# Patient Record
Sex: Female | Born: 1950
Health system: Southern US, Community
[De-identification: ages and names within clinical notes are randomized; demographics above are authoritative.]

## PROBLEM LIST (undated history)

## (undated) DIAGNOSIS — J3089 Other allergic rhinitis: Secondary | ICD-10-CM

## (undated) DIAGNOSIS — C4492 Squamous cell carcinoma of skin, unspecified: Secondary | ICD-10-CM

## (undated) HISTORY — DX: Other allergic rhinitis: J30.89

## (undated) HISTORY — PX: VSD REPAIR: SHX276

---
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
## 1999-01-23 ENCOUNTER — Ambulatory Visit (HOSPITAL_COMMUNITY): Admission: RE | Admit: 1999-01-23 | Discharge: 1999-01-23 | Payer: Self-pay | Admitting: Pediatrics

## 1999-01-23 ENCOUNTER — Encounter: Payer: Self-pay | Admitting: Gynecology

## 1999-01-30 ENCOUNTER — Encounter: Payer: Self-pay | Admitting: Gynecology

## 1999-01-30 ENCOUNTER — Ambulatory Visit (HOSPITAL_COMMUNITY): Admission: RE | Admit: 1999-01-30 | Discharge: 1999-01-30 | Payer: Self-pay | Admitting: Gynecology

## 2000-02-19 ENCOUNTER — Ambulatory Visit (HOSPITAL_COMMUNITY): Admission: RE | Admit: 2000-02-19 | Discharge: 2000-02-19 | Payer: Self-pay | Admitting: Gynecology

## 2000-02-19 ENCOUNTER — Encounter: Payer: Self-pay | Admitting: Gynecology

## 2001-02-21 ENCOUNTER — Encounter: Payer: Self-pay | Admitting: Gynecology

## 2001-02-21 ENCOUNTER — Ambulatory Visit (HOSPITAL_COMMUNITY): Admission: RE | Admit: 2001-02-21 | Discharge: 2001-02-21 | Payer: Self-pay | Admitting: Gynecology

## 2001-09-13 ENCOUNTER — Other Ambulatory Visit: Admission: RE | Admit: 2001-09-13 | Discharge: 2001-09-13 | Payer: Self-pay | Admitting: Gynecology

## 2002-06-15 ENCOUNTER — Encounter: Payer: Self-pay | Admitting: Gynecology

## 2002-06-15 ENCOUNTER — Ambulatory Visit (HOSPITAL_COMMUNITY): Admission: RE | Admit: 2002-06-15 | Discharge: 2002-06-15 | Payer: Self-pay | Admitting: Gynecology

## 2002-09-28 ENCOUNTER — Other Ambulatory Visit: Admission: RE | Admit: 2002-09-28 | Discharge: 2002-09-28 | Payer: Self-pay | Admitting: Gynecology

## 2003-07-20 ENCOUNTER — Ambulatory Visit (HOSPITAL_COMMUNITY): Admission: RE | Admit: 2003-07-20 | Discharge: 2003-07-20 | Payer: Self-pay | Admitting: Gynecology

## 2003-07-20 ENCOUNTER — Encounter: Payer: Self-pay | Admitting: Gynecology

## 2003-10-29 ENCOUNTER — Other Ambulatory Visit: Admission: RE | Admit: 2003-10-29 | Discharge: 2003-10-29 | Payer: Self-pay | Admitting: Gynecology

## 2004-02-15 ENCOUNTER — Ambulatory Visit (HOSPITAL_COMMUNITY): Admission: RE | Admit: 2004-02-15 | Discharge: 2004-02-15 | Payer: Self-pay | Admitting: Gastroenterology

## 2004-08-11 ENCOUNTER — Ambulatory Visit (HOSPITAL_COMMUNITY): Admission: RE | Admit: 2004-08-11 | Discharge: 2004-08-11 | Payer: Self-pay | Admitting: Gynecology

## 2004-11-03 ENCOUNTER — Other Ambulatory Visit: Admission: RE | Admit: 2004-11-03 | Discharge: 2004-11-03 | Payer: Self-pay | Admitting: Gynecology

## 2005-09-30 ENCOUNTER — Ambulatory Visit (HOSPITAL_COMMUNITY): Admission: RE | Admit: 2005-09-30 | Discharge: 2005-09-30 | Payer: Self-pay | Admitting: Gynecology

## 2006-10-01 ENCOUNTER — Ambulatory Visit (HOSPITAL_COMMUNITY): Admission: RE | Admit: 2006-10-01 | Discharge: 2006-10-01 | Payer: Self-pay | Admitting: Gynecology

## 2007-10-03 ENCOUNTER — Ambulatory Visit (HOSPITAL_COMMUNITY): Admission: RE | Admit: 2007-10-03 | Discharge: 2007-10-03 | Payer: Self-pay | Admitting: Gynecology

## 2008-10-16 ENCOUNTER — Ambulatory Visit (HOSPITAL_COMMUNITY): Admission: RE | Admit: 2008-10-16 | Discharge: 2008-10-16 | Payer: Self-pay | Admitting: Gynecology

## 2009-10-17 ENCOUNTER — Ambulatory Visit (HOSPITAL_COMMUNITY): Admission: RE | Admit: 2009-10-17 | Discharge: 2009-10-17 | Payer: Self-pay | Admitting: Gynecology

## 2010-10-22 ENCOUNTER — Ambulatory Visit (HOSPITAL_COMMUNITY)
Admission: RE | Admit: 2010-10-22 | Discharge: 2010-10-22 | Payer: Self-pay | Source: Home / Self Care | Admitting: Gynecology

## 2011-01-07 ENCOUNTER — Other Ambulatory Visit (HOSPITAL_COMMUNITY): Payer: Self-pay | Admitting: Cardiology

## 2011-01-07 DIAGNOSIS — Q211 Atrial septal defect: Secondary | ICD-10-CM

## 2011-01-13 ENCOUNTER — Ambulatory Visit (HOSPITAL_COMMUNITY)
Admission: RE | Admit: 2011-01-13 | Discharge: 2011-01-13 | Disposition: A | Discharge status: Home / Self Care | Payer: PRIVATE HEALTH INSURANCE | Source: Ambulatory Visit | Attending: Cardiology | Admitting: Cardiology

## 2011-01-13 DIAGNOSIS — Q2111 Secundum atrial septal defect: Secondary | ICD-10-CM | POA: Insufficient documentation

## 2011-01-13 DIAGNOSIS — Q211 Atrial septal defect: Secondary | ICD-10-CM | POA: Insufficient documentation

## 2011-01-13 LAB — CREATININE, SERUM: GFR calc non Af Amer: >60 mL/min (ref >60)

## 2011-02-18 ENCOUNTER — Ambulatory Visit (HOSPITAL_COMMUNITY)
Admission: RE | Admit: 2011-02-18 | Discharge: 2011-02-18 | Disposition: A | Discharge status: Home / Self Care | Payer: PRIVATE HEALTH INSURANCE | Source: Ambulatory Visit | Attending: Cardiology | Admitting: Cardiology

## 2011-02-18 ENCOUNTER — Other Ambulatory Visit: Payer: Self-pay | Admitting: Cardiology

## 2011-02-18 DIAGNOSIS — Q2111 Secundum atrial septal defect: Secondary | ICD-10-CM | POA: Insufficient documentation

## 2011-02-18 DIAGNOSIS — Q211 Atrial septal defect: Secondary | ICD-10-CM | POA: Insufficient documentation

## 2011-03-12 ENCOUNTER — Ambulatory Visit: Payer: PRIVATE HEALTH INSURANCE | Admitting: Cardiovascular Disease

## 2011-05-07 ENCOUNTER — Other Ambulatory Visit: Payer: Self-pay | Admitting: Internal Medicine

## 2011-05-08 ENCOUNTER — Ambulatory Visit
Admission: RE | Admit: 2011-05-08 | Discharge: 2011-05-08 | Disposition: A | Discharge status: Home / Self Care | Payer: PRIVATE HEALTH INSURANCE | Source: Ambulatory Visit | Attending: Internal Medicine | Admitting: Internal Medicine

## 2011-05-08 ENCOUNTER — Other Ambulatory Visit: Payer: Self-pay | Admitting: Internal Medicine

## 2011-05-12 ENCOUNTER — Ambulatory Visit
Admission: RE | Admit: 2011-05-12 | Discharge: 2011-05-12 | Disposition: A | Discharge status: Home / Self Care | Payer: PRIVATE HEALTH INSURANCE | Source: Ambulatory Visit | Attending: Internal Medicine | Admitting: Internal Medicine

## 2011-09-21 ENCOUNTER — Other Ambulatory Visit (HOSPITAL_COMMUNITY): Payer: Self-pay | Admitting: Gynecology

## 2011-09-21 DIAGNOSIS — Z1231 Encounter for screening mammogram for malignant neoplasm of breast: Secondary | ICD-10-CM

## 2011-10-26 ENCOUNTER — Ambulatory Visit (HOSPITAL_COMMUNITY)
Admission: RE | Admit: 2011-10-26 | Discharge: 2011-10-26 | Disposition: A | Discharge status: Home / Self Care | Payer: PRIVATE HEALTH INSURANCE | Source: Ambulatory Visit | Attending: Gynecology | Admitting: Gynecology

## 2011-10-26 DIAGNOSIS — Z1231 Encounter for screening mammogram for malignant neoplasm of breast: Secondary | ICD-10-CM | POA: Insufficient documentation

## 2012-10-05 ENCOUNTER — Other Ambulatory Visit (HOSPITAL_COMMUNITY): Payer: Self-pay | Admitting: Gynecology

## 2012-10-05 DIAGNOSIS — Z1231 Encounter for screening mammogram for malignant neoplasm of breast: Secondary | ICD-10-CM

## 2012-10-31 ENCOUNTER — Ambulatory Visit (HOSPITAL_COMMUNITY): Payer: PRIVATE HEALTH INSURANCE

## 2012-11-03 ENCOUNTER — Ambulatory Visit (HOSPITAL_COMMUNITY)
Admission: RE | Admit: 2012-11-03 | Discharge: 2012-11-03 | Disposition: A | Discharge status: Home / Self Care | Payer: Commercial Managed Care - PPO | Source: Ambulatory Visit | Attending: Gynecology | Admitting: Gynecology

## 2012-11-03 DIAGNOSIS — Z1231 Encounter for screening mammogram for malignant neoplasm of breast: Secondary | ICD-10-CM

## 2013-06-22 ENCOUNTER — Other Ambulatory Visit (HOSPITAL_COMMUNITY): Payer: Self-pay | Admitting: Internal Medicine

## 2013-06-22 DIAGNOSIS — R1013 Epigastric pain: Secondary | ICD-10-CM

## 2013-06-27 ENCOUNTER — Ambulatory Visit (HOSPITAL_COMMUNITY)
Admission: RE | Admit: 2013-06-27 | Discharge: 2013-06-27 | Disposition: A | Discharge status: Home / Self Care | Payer: Commercial Managed Care - PPO | Source: Ambulatory Visit | Attending: Internal Medicine | Admitting: Internal Medicine

## 2013-06-27 DIAGNOSIS — R1013 Epigastric pain: Secondary | ICD-10-CM | POA: Insufficient documentation

## 2013-06-27 MED ORDER — TECHNETIUM TC 99M SULFUR COLLOID
2.0000 | Freq: Once | INTRAVENOUS | Status: AC | PRN
  Administered 2013-06-27: 2 via ORAL

## 2013-10-09 ENCOUNTER — Other Ambulatory Visit (HOSPITAL_COMMUNITY): Payer: Self-pay | Admitting: Gynecology

## 2013-10-09 DIAGNOSIS — Z1231 Encounter for screening mammogram for malignant neoplasm of breast: Secondary | ICD-10-CM

## 2013-11-06 ENCOUNTER — Ambulatory Visit (HOSPITAL_COMMUNITY)
Admission: RE | Admit: 2013-11-06 | Discharge: 2013-11-06 | Disposition: A | Discharge status: Home / Self Care | Payer: Commercial Managed Care - PPO | Source: Ambulatory Visit | Attending: Gynecology | Admitting: Gynecology

## 2013-11-06 DIAGNOSIS — Z1231 Encounter for screening mammogram for malignant neoplasm of breast: Secondary | ICD-10-CM

## 2014-10-12 ENCOUNTER — Other Ambulatory Visit (HOSPITAL_COMMUNITY): Payer: Self-pay | Admitting: Gynecology

## 2014-10-12 DIAGNOSIS — Z1231 Encounter for screening mammogram for malignant neoplasm of breast: Secondary | ICD-10-CM

## 2014-11-13 ENCOUNTER — Ambulatory Visit (HOSPITAL_COMMUNITY)
Admission: RE | Admit: 2014-11-13 | Discharge: 2014-11-13 | Disposition: A | Discharge status: Home / Self Care | Payer: 59 | Source: Ambulatory Visit | Attending: Gynecology | Admitting: Gynecology

## 2014-11-13 DIAGNOSIS — Z1231 Encounter for screening mammogram for malignant neoplasm of breast: Secondary | ICD-10-CM | POA: Insufficient documentation

## 2015-05-07 ENCOUNTER — Ambulatory Visit
Admission: RE | Admit: 2015-05-07 | Discharge: 2015-05-07 | Disposition: A | Discharge status: Home / Self Care | Payer: 59 | Source: Ambulatory Visit | Attending: Internal Medicine | Admitting: Internal Medicine

## 2015-05-07 ENCOUNTER — Other Ambulatory Visit: Payer: Self-pay | Admitting: Internal Medicine

## 2015-05-07 DIAGNOSIS — R05 Cough: Secondary | ICD-10-CM

## 2015-05-07 DIAGNOSIS — R059 Cough, unspecified: Secondary | ICD-10-CM

## 2015-05-08 ENCOUNTER — Other Ambulatory Visit: Payer: Self-pay | Admitting: Internal Medicine

## 2015-05-08 DIAGNOSIS — R053 Chronic cough: Secondary | ICD-10-CM

## 2015-05-08 DIAGNOSIS — R05 Cough: Secondary | ICD-10-CM

## 2015-05-10 ENCOUNTER — Other Ambulatory Visit: Payer: 59

## 2015-06-21 ENCOUNTER — Other Ambulatory Visit: Payer: Self-pay | Admitting: Internal Medicine

## 2015-06-21 ENCOUNTER — Ambulatory Visit
Admission: RE | Admit: 2015-06-21 | Discharge: 2015-06-21 | Disposition: A | Discharge status: Home / Self Care | Payer: 59 | Source: Ambulatory Visit | Attending: Internal Medicine | Admitting: Internal Medicine

## 2015-06-21 DIAGNOSIS — R053 Chronic cough: Secondary | ICD-10-CM

## 2015-06-21 DIAGNOSIS — R05 Cough: Secondary | ICD-10-CM

## 2015-07-18 DIAGNOSIS — Z1389 Encounter for screening for other disorder: Secondary | ICD-10-CM | POA: Diagnosis not present

## 2015-07-18 DIAGNOSIS — G4700 Insomnia, unspecified: Secondary | ICD-10-CM | POA: Diagnosis not present

## 2015-07-18 DIAGNOSIS — R05 Cough: Secondary | ICD-10-CM | POA: Diagnosis not present

## 2015-08-09 DIAGNOSIS — R197 Diarrhea, unspecified: Secondary | ICD-10-CM | POA: Diagnosis not present

## 2015-08-19 DIAGNOSIS — K5289 Other specified noninfective gastroenteritis and colitis: Secondary | ICD-10-CM | POA: Diagnosis not present

## 2015-08-19 DIAGNOSIS — K529 Noninfective gastroenteritis and colitis, unspecified: Secondary | ICD-10-CM | POA: Diagnosis not present

## 2015-08-19 DIAGNOSIS — R197 Diarrhea, unspecified: Secondary | ICD-10-CM | POA: Diagnosis not present

## 2015-08-20 DIAGNOSIS — Z23 Encounter for immunization: Secondary | ICD-10-CM | POA: Diagnosis not present

## 2015-08-28 DIAGNOSIS — R05 Cough: Secondary | ICD-10-CM | POA: Diagnosis not present

## 2015-08-28 DIAGNOSIS — M545 Low back pain: Secondary | ICD-10-CM | POA: Diagnosis not present

## 2015-09-05 DIAGNOSIS — Z6821 Body mass index (BMI) 21.0-21.9, adult: Secondary | ICD-10-CM | POA: Diagnosis not present

## 2015-09-05 DIAGNOSIS — Z124 Encounter for screening for malignant neoplasm of cervix: Secondary | ICD-10-CM | POA: Diagnosis not present

## 2015-09-09 ENCOUNTER — Encounter: Payer: Self-pay | Admitting: Pulmonary Disease

## 2015-09-09 ENCOUNTER — Ambulatory Visit (INDEPENDENT_AMBULATORY_CARE_PROVIDER_SITE_OTHER): Payer: Medicare Other | Admitting: Pulmonary Disease

## 2015-09-09 VITALS — BP 118/68 | HR 72 | Ht 65.0 in | Wt 128.0 lb

## 2015-09-09 DIAGNOSIS — R05 Cough: Secondary | ICD-10-CM

## 2015-09-09 DIAGNOSIS — J3089 Other allergic rhinitis: Secondary | ICD-10-CM | POA: Diagnosis not present

## 2015-09-09 DIAGNOSIS — R059 Cough, unspecified: Secondary | ICD-10-CM

## 2015-09-09 NOTE — Progress Notes (Signed)
Subjective:    Patient ID: Whitney Johnson, female    DOB: [birth year], 64 y.o.   MRN: [id number]  HPI Consult for evaluation of chronic cough.  [name] is a healthy 64 year old. She is sent to our clinic for evaluation of chronic cough. The cough started in January and was treated at that time with a course of prednisone and antibiotics. She states that this improved her symptoms initially but then they have returned. She saw her primary care physician for this and got a couple of x-rays which did not show any acute cardiopulmonary process. She was tried on Nexium for treatment of GERD. She took this for 30 days but stopped because it did not help with her symptoms. She was also given Qvar inhaler which did not help as well. She denies any GERD symptoms. She has seasonal allergies and has severe rhinitis and postnasal drip. She is prescribed fluticasone nasal spray which she uses intermittently. She started taking Allegra over-the-counter for the past 1 week and states that this has improved her symptoms a lot.  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 There are no obvious exposures at work or at home. She has mostly hardwood at home with some areas of carpet. She uses forced air. There is no evidence of mold or dust exposure at home. She has a cat but states that she is not allergic to it.  Social history. She quit smoking in 98. Occasional alcohol use. No illegal drug use.  Past Medical History  Diagnosis Date  . Environmental and seasonal allergies      Current outpatient prescriptions:  .  aspirin 81 MG tablet, Take 81 mg by mouth daily., Disp: , Rfl:  .  fluticasone (VERAMYST) 27.5 MCG/SPRAY nasal spray, Place 2 sprays into the nose daily., Disp: , Rfl:  .  FLUZONE HIGH-DOSE 0.5 ML SUSY, ADM 0.5ML IM UTD, Disp: , Rfl: 0 .  venlafaxine XR (EFFEXOR-XR) 150 MG 24 hr capsule, Take 150 mg by mouth daily with breakfast., Disp: , Rfl:  .  zolpidem (AMBIEN) 10 MG  tablet, Take 10 mg by mouth at bedtime as needed for sleep., Disp: , Rfl:    Review of Systems  Complains of nonproductive cough. Denies any dyspnea, wheezing, sputum production, hemoptysis. Has allergic rhinitis, postnasal drip. Denies any heartburn or acid reflux. Denies any chest pain, palpitations. Denies any nausea, vomiting, diarrhea, constipation. All other review of systems negative.  Blood pressure 118/68, pulse 72, height 5\' 5"  (1.651 m), weight 128 lb (58.06 kg), SpO2 95 %.     Objective:   Physical Exam Gen.: No apparent distress Neuro: No gross focal deficits. Neck: No JVD, lymphadenopathy, thyromegaly. RS: Clear, no wheeze, crackles. CVS: S1-S2 heard, no murmurs rubs gallops. Abdomen: Soft, positive bowel sounds. Extremities: No edema.    Assessment & Plan:   Chronic cough.  It is likely that her cough is related to her allergies, rhinitis, postnasal drip. She may have silent acid reflux. But a trial of PPI has been unsuccessful in the past. Cough variant asthma is also a possibility but she has not had a response to Qvar either. Her symptoms have improved since the referral was made after she started herself on Allegra. She feels that she is doing better now and does not want an extensive evaluation.  I discussed with her that she should continue the Allegra and take the fluticasone nasal spray more regularly. If her symptoms should worsen then  we can consider getting lung function tests with a bronchodilator response, retrying her on a higher dose of PPI and an ENT consult to evaluate laryngeal pharyngeal reflux. For now she would prefer to continue the treatment as she is doing.  Plan: - Continue the allegra. - Take the Fluticasone nasal spray more regularly  Return to clinic in 6 months.  Marshell Garfinkel MD [facility] Pager [phone] If no answer or after 3pm call: [phone] 09/09/2015, 12:46 PM

## 2015-09-09 NOTE — Patient Instructions (Addendum)
Please continue using the allegra and fluticasone nasal spray.  We will make a appointment to return in 6 months. Please call if you need to be seen earlier.

## 2015-09-13 DIAGNOSIS — K52832 Lymphocytic colitis: Secondary | ICD-10-CM | POA: Diagnosis not present

## 2015-10-03 DIAGNOSIS — Z1231 Encounter for screening mammogram for malignant neoplasm of breast: Secondary | ICD-10-CM | POA: Diagnosis not present

## 2015-10-03 DIAGNOSIS — N958 Other specified menopausal and perimenopausal disorders: Secondary | ICD-10-CM | POA: Diagnosis not present

## 2015-12-12 DIAGNOSIS — Z0000 Encounter for general adult medical examination without abnormal findings: Secondary | ICD-10-CM | POA: Diagnosis not present

## 2015-12-12 DIAGNOSIS — Z1389 Encounter for screening for other disorder: Secondary | ICD-10-CM | POA: Diagnosis not present

## 2015-12-12 DIAGNOSIS — Z136 Encounter for screening for cardiovascular disorders: Secondary | ICD-10-CM | POA: Diagnosis not present

## 2015-12-12 DIAGNOSIS — Z79899 Other long term (current) drug therapy: Secondary | ICD-10-CM | POA: Diagnosis not present

## 2015-12-12 DIAGNOSIS — J310 Chronic rhinitis: Secondary | ICD-10-CM | POA: Diagnosis not present

## 2015-12-25 DIAGNOSIS — M25551 Pain in right hip: Secondary | ICD-10-CM | POA: Diagnosis not present

## 2015-12-25 DIAGNOSIS — M7071 Other bursitis of hip, right hip: Secondary | ICD-10-CM | POA: Diagnosis not present

## 2016-02-12 DIAGNOSIS — M7542 Impingement syndrome of left shoulder: Secondary | ICD-10-CM | POA: Diagnosis not present

## 2016-02-24 DIAGNOSIS — H6122 Impacted cerumen, left ear: Secondary | ICD-10-CM | POA: Diagnosis not present

## 2016-03-11 DIAGNOSIS — M67912 Unspecified disorder of synovium and tendon, left shoulder: Secondary | ICD-10-CM | POA: Diagnosis not present

## 2016-03-12 DIAGNOSIS — M67912 Unspecified disorder of synovium and tendon, left shoulder: Secondary | ICD-10-CM | POA: Diagnosis not present

## 2016-03-30 DIAGNOSIS — M7502 Adhesive capsulitis of left shoulder: Secondary | ICD-10-CM | POA: Diagnosis not present

## 2016-04-06 DIAGNOSIS — J302 Other seasonal allergic rhinitis: Secondary | ICD-10-CM | POA: Diagnosis not present

## 2016-04-14 DIAGNOSIS — M25512 Pain in left shoulder: Secondary | ICD-10-CM | POA: Diagnosis not present

## 2016-05-01 DIAGNOSIS — M7502 Adhesive capsulitis of left shoulder: Secondary | ICD-10-CM | POA: Diagnosis not present

## 2016-05-05 DIAGNOSIS — M25612 Stiffness of left shoulder, not elsewhere classified: Secondary | ICD-10-CM | POA: Diagnosis not present

## 2016-05-05 DIAGNOSIS — M75122 Complete rotator cuff tear or rupture of left shoulder, not specified as traumatic: Secondary | ICD-10-CM | POA: Diagnosis not present

## 2016-05-05 DIAGNOSIS — M25512 Pain in left shoulder: Secondary | ICD-10-CM | POA: Diagnosis not present

## 2016-05-06 DIAGNOSIS — M25612 Stiffness of left shoulder, not elsewhere classified: Secondary | ICD-10-CM | POA: Diagnosis not present

## 2016-05-06 DIAGNOSIS — M75122 Complete rotator cuff tear or rupture of left shoulder, not specified as traumatic: Secondary | ICD-10-CM | POA: Diagnosis not present

## 2016-05-06 DIAGNOSIS — M25512 Pain in left shoulder: Secondary | ICD-10-CM | POA: Diagnosis not present

## 2016-05-08 DIAGNOSIS — M25612 Stiffness of left shoulder, not elsewhere classified: Secondary | ICD-10-CM | POA: Diagnosis not present

## 2016-05-08 DIAGNOSIS — M25512 Pain in left shoulder: Secondary | ICD-10-CM | POA: Diagnosis not present

## 2016-05-08 DIAGNOSIS — M75122 Complete rotator cuff tear or rupture of left shoulder, not specified as traumatic: Secondary | ICD-10-CM | POA: Diagnosis not present

## 2016-05-12 DIAGNOSIS — M25512 Pain in left shoulder: Secondary | ICD-10-CM | POA: Diagnosis not present

## 2016-05-12 DIAGNOSIS — M75122 Complete rotator cuff tear or rupture of left shoulder, not specified as traumatic: Secondary | ICD-10-CM | POA: Diagnosis not present

## 2016-05-12 DIAGNOSIS — M25612 Stiffness of left shoulder, not elsewhere classified: Secondary | ICD-10-CM | POA: Diagnosis not present

## 2016-05-14 DIAGNOSIS — M75122 Complete rotator cuff tear or rupture of left shoulder, not specified as traumatic: Secondary | ICD-10-CM | POA: Diagnosis not present

## 2016-05-14 DIAGNOSIS — M25612 Stiffness of left shoulder, not elsewhere classified: Secondary | ICD-10-CM | POA: Diagnosis not present

## 2016-05-14 DIAGNOSIS — M25512 Pain in left shoulder: Secondary | ICD-10-CM | POA: Diagnosis not present

## 2016-05-15 DIAGNOSIS — M75122 Complete rotator cuff tear or rupture of left shoulder, not specified as traumatic: Secondary | ICD-10-CM | POA: Diagnosis not present

## 2016-05-15 DIAGNOSIS — M25512 Pain in left shoulder: Secondary | ICD-10-CM | POA: Diagnosis not present

## 2016-05-15 DIAGNOSIS — M25612 Stiffness of left shoulder, not elsewhere classified: Secondary | ICD-10-CM | POA: Diagnosis not present

## 2016-05-19 DIAGNOSIS — M25512 Pain in left shoulder: Secondary | ICD-10-CM | POA: Diagnosis not present

## 2016-05-19 DIAGNOSIS — M75122 Complete rotator cuff tear or rupture of left shoulder, not specified as traumatic: Secondary | ICD-10-CM | POA: Diagnosis not present

## 2016-05-19 DIAGNOSIS — M25612 Stiffness of left shoulder, not elsewhere classified: Secondary | ICD-10-CM | POA: Diagnosis not present

## 2016-05-21 DIAGNOSIS — M25512 Pain in left shoulder: Secondary | ICD-10-CM | POA: Diagnosis not present

## 2016-05-21 DIAGNOSIS — M75122 Complete rotator cuff tear or rupture of left shoulder, not specified as traumatic: Secondary | ICD-10-CM | POA: Diagnosis not present

## 2016-05-21 DIAGNOSIS — M25612 Stiffness of left shoulder, not elsewhere classified: Secondary | ICD-10-CM | POA: Diagnosis not present

## 2016-05-27 DIAGNOSIS — M75122 Complete rotator cuff tear or rupture of left shoulder, not specified as traumatic: Secondary | ICD-10-CM | POA: Diagnosis not present

## 2016-05-27 DIAGNOSIS — M25512 Pain in left shoulder: Secondary | ICD-10-CM | POA: Diagnosis not present

## 2016-05-27 DIAGNOSIS — M25612 Stiffness of left shoulder, not elsewhere classified: Secondary | ICD-10-CM | POA: Diagnosis not present

## 2016-05-28 DIAGNOSIS — M25612 Stiffness of left shoulder, not elsewhere classified: Secondary | ICD-10-CM | POA: Diagnosis not present

## 2016-05-28 DIAGNOSIS — M75122 Complete rotator cuff tear or rupture of left shoulder, not specified as traumatic: Secondary | ICD-10-CM | POA: Diagnosis not present

## 2016-05-28 DIAGNOSIS — M25512 Pain in left shoulder: Secondary | ICD-10-CM | POA: Diagnosis not present

## 2016-05-29 DIAGNOSIS — F4320 Adjustment disorder, unspecified: Secondary | ICD-10-CM | POA: Diagnosis not present

## 2016-05-29 DIAGNOSIS — K52832 Lymphocytic colitis: Secondary | ICD-10-CM | POA: Diagnosis not present

## 2016-06-09 DIAGNOSIS — M25512 Pain in left shoulder: Secondary | ICD-10-CM | POA: Diagnosis not present

## 2016-06-09 DIAGNOSIS — M75122 Complete rotator cuff tear or rupture of left shoulder, not specified as traumatic: Secondary | ICD-10-CM | POA: Diagnosis not present

## 2016-06-09 DIAGNOSIS — M25612 Stiffness of left shoulder, not elsewhere classified: Secondary | ICD-10-CM | POA: Diagnosis not present

## 2016-06-12 DIAGNOSIS — M25612 Stiffness of left shoulder, not elsewhere classified: Secondary | ICD-10-CM | POA: Diagnosis not present

## 2016-06-12 DIAGNOSIS — M75122 Complete rotator cuff tear or rupture of left shoulder, not specified as traumatic: Secondary | ICD-10-CM | POA: Diagnosis not present

## 2016-06-12 DIAGNOSIS — M25512 Pain in left shoulder: Secondary | ICD-10-CM | POA: Diagnosis not present

## 2016-06-16 DIAGNOSIS — M75122 Complete rotator cuff tear or rupture of left shoulder, not specified as traumatic: Secondary | ICD-10-CM | POA: Diagnosis not present

## 2016-06-16 DIAGNOSIS — M25512 Pain in left shoulder: Secondary | ICD-10-CM | POA: Diagnosis not present

## 2016-06-16 DIAGNOSIS — M25612 Stiffness of left shoulder, not elsewhere classified: Secondary | ICD-10-CM | POA: Diagnosis not present

## 2016-06-18 DIAGNOSIS — M25512 Pain in left shoulder: Secondary | ICD-10-CM | POA: Diagnosis not present

## 2016-06-18 DIAGNOSIS — M25612 Stiffness of left shoulder, not elsewhere classified: Secondary | ICD-10-CM | POA: Diagnosis not present

## 2016-06-18 DIAGNOSIS — M75122 Complete rotator cuff tear or rupture of left shoulder, not specified as traumatic: Secondary | ICD-10-CM | POA: Diagnosis not present

## 2016-06-19 DIAGNOSIS — K52832 Lymphocytic colitis: Secondary | ICD-10-CM | POA: Diagnosis not present

## 2016-06-23 DIAGNOSIS — M25612 Stiffness of left shoulder, not elsewhere classified: Secondary | ICD-10-CM | POA: Diagnosis not present

## 2016-06-23 DIAGNOSIS — M75122 Complete rotator cuff tear or rupture of left shoulder, not specified as traumatic: Secondary | ICD-10-CM | POA: Diagnosis not present

## 2016-06-23 DIAGNOSIS — M25512 Pain in left shoulder: Secondary | ICD-10-CM | POA: Diagnosis not present

## 2016-06-25 DIAGNOSIS — M25612 Stiffness of left shoulder, not elsewhere classified: Secondary | ICD-10-CM | POA: Diagnosis not present

## 2016-06-25 DIAGNOSIS — M25512 Pain in left shoulder: Secondary | ICD-10-CM | POA: Diagnosis not present

## 2016-06-25 DIAGNOSIS — M75122 Complete rotator cuff tear or rupture of left shoulder, not specified as traumatic: Secondary | ICD-10-CM | POA: Diagnosis not present

## 2016-06-29 DIAGNOSIS — M7502 Adhesive capsulitis of left shoulder: Secondary | ICD-10-CM | POA: Diagnosis not present

## 2016-07-08 DIAGNOSIS — H9313 Tinnitus, bilateral: Secondary | ICD-10-CM | POA: Diagnosis not present

## 2016-09-21 DIAGNOSIS — G4700 Insomnia, unspecified: Secondary | ICD-10-CM | POA: Diagnosis not present

## 2016-09-21 DIAGNOSIS — J302 Other seasonal allergic rhinitis: Secondary | ICD-10-CM | POA: Diagnosis not present

## 2016-09-21 DIAGNOSIS — F4320 Adjustment disorder, unspecified: Secondary | ICD-10-CM | POA: Diagnosis not present

## 2016-09-21 DIAGNOSIS — K52832 Lymphocytic colitis: Secondary | ICD-10-CM | POA: Diagnosis not present

## 2016-10-27 DIAGNOSIS — Z1231 Encounter for screening mammogram for malignant neoplasm of breast: Secondary | ICD-10-CM | POA: Diagnosis not present

## 2016-10-27 DIAGNOSIS — Z01419 Encounter for gynecological examination (general) (routine) without abnormal findings: Secondary | ICD-10-CM | POA: Diagnosis not present

## 2016-10-27 DIAGNOSIS — Z6820 Body mass index (BMI) 20.0-20.9, adult: Secondary | ICD-10-CM | POA: Diagnosis not present

## 2016-11-24 DIAGNOSIS — J069 Acute upper respiratory infection, unspecified: Secondary | ICD-10-CM | POA: Diagnosis not present

## 2016-11-24 DIAGNOSIS — J209 Acute bronchitis, unspecified: Secondary | ICD-10-CM | POA: Diagnosis not present

## 2016-12-08 DIAGNOSIS — J310 Chronic rhinitis: Secondary | ICD-10-CM | POA: Diagnosis not present

## 2016-12-08 DIAGNOSIS — G4700 Insomnia, unspecified: Secondary | ICD-10-CM | POA: Diagnosis not present

## 2016-12-08 DIAGNOSIS — Z0000 Encounter for general adult medical examination without abnormal findings: Secondary | ICD-10-CM | POA: Diagnosis not present

## 2016-12-08 DIAGNOSIS — K52832 Lymphocytic colitis: Secondary | ICD-10-CM | POA: Diagnosis not present

## 2016-12-08 DIAGNOSIS — H6122 Impacted cerumen, left ear: Secondary | ICD-10-CM | POA: Diagnosis not present

## 2016-12-08 DIAGNOSIS — J40 Bronchitis, not specified as acute or chronic: Secondary | ICD-10-CM | POA: Diagnosis not present

## 2016-12-08 DIAGNOSIS — F419 Anxiety disorder, unspecified: Secondary | ICD-10-CM | POA: Diagnosis not present

## 2016-12-22 DIAGNOSIS — Z23 Encounter for immunization: Secondary | ICD-10-CM | POA: Diagnosis not present

## 2016-12-23 DIAGNOSIS — J209 Acute bronchitis, unspecified: Secondary | ICD-10-CM | POA: Diagnosis not present

## 2016-12-23 DIAGNOSIS — J180 Bronchopneumonia, unspecified organism: Secondary | ICD-10-CM | POA: Diagnosis not present

## 2016-12-25 DIAGNOSIS — M25551 Pain in right hip: Secondary | ICD-10-CM | POA: Diagnosis not present

## 2016-12-29 DIAGNOSIS — M542 Cervicalgia: Secondary | ICD-10-CM | POA: Diagnosis not present

## 2016-12-31 DIAGNOSIS — M542 Cervicalgia: Secondary | ICD-10-CM | POA: Diagnosis not present

## 2017-01-05 DIAGNOSIS — M542 Cervicalgia: Secondary | ICD-10-CM | POA: Diagnosis not present

## 2017-01-05 DIAGNOSIS — G4700 Insomnia, unspecified: Secondary | ICD-10-CM | POA: Diagnosis not present

## 2017-01-05 DIAGNOSIS — J189 Pneumonia, unspecified organism: Secondary | ICD-10-CM | POA: Diagnosis not present

## 2017-01-07 DIAGNOSIS — M542 Cervicalgia: Secondary | ICD-10-CM | POA: Diagnosis not present

## 2017-01-12 DIAGNOSIS — M542 Cervicalgia: Secondary | ICD-10-CM | POA: Diagnosis not present

## 2017-01-14 DIAGNOSIS — M542 Cervicalgia: Secondary | ICD-10-CM | POA: Diagnosis not present

## 2017-01-19 DIAGNOSIS — M542 Cervicalgia: Secondary | ICD-10-CM | POA: Diagnosis not present

## 2017-01-21 DIAGNOSIS — M542 Cervicalgia: Secondary | ICD-10-CM | POA: Diagnosis not present

## 2017-01-25 DIAGNOSIS — M542 Cervicalgia: Secondary | ICD-10-CM | POA: Diagnosis not present

## 2017-01-26 DIAGNOSIS — M542 Cervicalgia: Secondary | ICD-10-CM | POA: Diagnosis not present

## 2017-01-28 DIAGNOSIS — M542 Cervicalgia: Secondary | ICD-10-CM | POA: Diagnosis not present

## 2017-02-02 DIAGNOSIS — M542 Cervicalgia: Secondary | ICD-10-CM | POA: Diagnosis not present

## 2017-02-04 DIAGNOSIS — M542 Cervicalgia: Secondary | ICD-10-CM | POA: Diagnosis not present

## 2017-02-09 DIAGNOSIS — M542 Cervicalgia: Secondary | ICD-10-CM | POA: Diagnosis not present

## 2017-02-11 DIAGNOSIS — M542 Cervicalgia: Secondary | ICD-10-CM | POA: Diagnosis not present

## 2017-02-16 DIAGNOSIS — M542 Cervicalgia: Secondary | ICD-10-CM | POA: Diagnosis not present

## 2017-02-18 DIAGNOSIS — M542 Cervicalgia: Secondary | ICD-10-CM | POA: Diagnosis not present

## 2017-02-23 DIAGNOSIS — M542 Cervicalgia: Secondary | ICD-10-CM | POA: Diagnosis not present

## 2017-02-24 DIAGNOSIS — K52832 Lymphocytic colitis: Secondary | ICD-10-CM | POA: Diagnosis not present

## 2017-03-01 DIAGNOSIS — M542 Cervicalgia: Secondary | ICD-10-CM | POA: Diagnosis not present

## 2017-03-09 DIAGNOSIS — M542 Cervicalgia: Secondary | ICD-10-CM | POA: Diagnosis not present

## 2017-03-12 DIAGNOSIS — M47812 Spondylosis without myelopathy or radiculopathy, cervical region: Secondary | ICD-10-CM | POA: Diagnosis not present

## 2017-03-23 DIAGNOSIS — M47812 Spondylosis without myelopathy or radiculopathy, cervical region: Secondary | ICD-10-CM | POA: Diagnosis not present

## 2017-04-19 DIAGNOSIS — M47812 Spondylosis without myelopathy or radiculopathy, cervical region: Secondary | ICD-10-CM | POA: Diagnosis not present

## 2017-04-23 DIAGNOSIS — K52832 Lymphocytic colitis: Secondary | ICD-10-CM | POA: Diagnosis not present

## 2017-05-11 DIAGNOSIS — M47812 Spondylosis without myelopathy or radiculopathy, cervical region: Secondary | ICD-10-CM | POA: Diagnosis not present

## 2017-06-10 DIAGNOSIS — M4722 Other spondylosis with radiculopathy, cervical region: Secondary | ICD-10-CM | POA: Diagnosis not present

## 2017-06-10 DIAGNOSIS — G4700 Insomnia, unspecified: Secondary | ICD-10-CM | POA: Diagnosis not present

## 2017-06-10 DIAGNOSIS — J302 Other seasonal allergic rhinitis: Secondary | ICD-10-CM | POA: Diagnosis not present

## 2017-06-10 DIAGNOSIS — L299 Pruritus, unspecified: Secondary | ICD-10-CM | POA: Diagnosis not present

## 2017-06-15 DIAGNOSIS — Z961 Presence of intraocular lens: Secondary | ICD-10-CM | POA: Diagnosis not present

## 2017-06-15 DIAGNOSIS — H52203 Unspecified astigmatism, bilateral: Secondary | ICD-10-CM | POA: Diagnosis not present

## 2017-06-15 DIAGNOSIS — H01001 Unspecified blepharitis right upper eyelid: Secondary | ICD-10-CM | POA: Diagnosis not present

## 2017-06-15 DIAGNOSIS — H43813 Vitreous degeneration, bilateral: Secondary | ICD-10-CM | POA: Diagnosis not present

## 2017-07-06 DIAGNOSIS — K52832 Lymphocytic colitis: Secondary | ICD-10-CM | POA: Diagnosis not present

## 2017-07-13 DIAGNOSIS — L299 Pruritus, unspecified: Secondary | ICD-10-CM | POA: Diagnosis not present

## 2017-07-13 DIAGNOSIS — L304 Erythema intertrigo: Secondary | ICD-10-CM | POA: Diagnosis not present

## 2017-07-13 DIAGNOSIS — D229 Melanocytic nevi, unspecified: Secondary | ICD-10-CM | POA: Diagnosis not present

## 2017-07-13 DIAGNOSIS — L821 Other seborrheic keratosis: Secondary | ICD-10-CM | POA: Diagnosis not present

## 2017-07-14 DIAGNOSIS — M47812 Spondylosis without myelopathy or radiculopathy, cervical region: Secondary | ICD-10-CM | POA: Diagnosis not present

## 2017-07-15 DIAGNOSIS — M542 Cervicalgia: Secondary | ICD-10-CM | POA: Diagnosis not present

## 2017-07-15 DIAGNOSIS — M47812 Spondylosis without myelopathy or radiculopathy, cervical region: Secondary | ICD-10-CM | POA: Diagnosis not present

## 2017-07-16 DIAGNOSIS — M47812 Spondylosis without myelopathy or radiculopathy, cervical region: Secondary | ICD-10-CM | POA: Diagnosis not present

## 2017-07-16 DIAGNOSIS — M542 Cervicalgia: Secondary | ICD-10-CM | POA: Diagnosis not present

## 2017-07-21 DIAGNOSIS — M47812 Spondylosis without myelopathy or radiculopathy, cervical region: Secondary | ICD-10-CM | POA: Diagnosis not present

## 2017-07-21 DIAGNOSIS — M542 Cervicalgia: Secondary | ICD-10-CM | POA: Diagnosis not present

## 2017-07-28 DIAGNOSIS — M542 Cervicalgia: Secondary | ICD-10-CM | POA: Diagnosis not present

## 2017-07-28 DIAGNOSIS — M47812 Spondylosis without myelopathy or radiculopathy, cervical region: Secondary | ICD-10-CM | POA: Diagnosis not present

## 2017-07-30 DIAGNOSIS — M542 Cervicalgia: Secondary | ICD-10-CM | POA: Diagnosis not present

## 2017-07-30 DIAGNOSIS — M47812 Spondylosis without myelopathy or radiculopathy, cervical region: Secondary | ICD-10-CM | POA: Diagnosis not present

## 2017-08-05 DIAGNOSIS — M47812 Spondylosis without myelopathy or radiculopathy, cervical region: Secondary | ICD-10-CM | POA: Diagnosis not present

## 2017-08-06 DIAGNOSIS — M542 Cervicalgia: Secondary | ICD-10-CM | POA: Diagnosis not present

## 2017-08-06 DIAGNOSIS — M47812 Spondylosis without myelopathy or radiculopathy, cervical region: Secondary | ICD-10-CM | POA: Diagnosis not present

## 2017-08-12 DIAGNOSIS — M47812 Spondylosis without myelopathy or radiculopathy, cervical region: Secondary | ICD-10-CM | POA: Diagnosis not present

## 2017-08-12 DIAGNOSIS — M542 Cervicalgia: Secondary | ICD-10-CM | POA: Diagnosis not present

## 2017-08-17 DIAGNOSIS — M47812 Spondylosis without myelopathy or radiculopathy, cervical region: Secondary | ICD-10-CM | POA: Diagnosis not present

## 2017-08-17 DIAGNOSIS — M542 Cervicalgia: Secondary | ICD-10-CM | POA: Diagnosis not present

## 2017-08-24 DIAGNOSIS — M47812 Spondylosis without myelopathy or radiculopathy, cervical region: Secondary | ICD-10-CM | POA: Diagnosis not present

## 2017-08-24 DIAGNOSIS — M542 Cervicalgia: Secondary | ICD-10-CM | POA: Diagnosis not present

## 2017-08-26 DIAGNOSIS — M47812 Spondylosis without myelopathy or radiculopathy, cervical region: Secondary | ICD-10-CM | POA: Diagnosis not present

## 2017-08-26 DIAGNOSIS — M542 Cervicalgia: Secondary | ICD-10-CM | POA: Diagnosis not present

## 2017-08-31 DIAGNOSIS — M47812 Spondylosis without myelopathy or radiculopathy, cervical region: Secondary | ICD-10-CM | POA: Diagnosis not present

## 2017-08-31 DIAGNOSIS — M542 Cervicalgia: Secondary | ICD-10-CM | POA: Diagnosis not present

## 2017-09-02 DIAGNOSIS — M47812 Spondylosis without myelopathy or radiculopathy, cervical region: Secondary | ICD-10-CM | POA: Diagnosis not present

## 2017-09-02 DIAGNOSIS — M542 Cervicalgia: Secondary | ICD-10-CM | POA: Diagnosis not present

## 2017-09-07 DIAGNOSIS — M542 Cervicalgia: Secondary | ICD-10-CM | POA: Diagnosis not present

## 2017-09-07 DIAGNOSIS — M47812 Spondylosis without myelopathy or radiculopathy, cervical region: Secondary | ICD-10-CM | POA: Diagnosis not present

## 2017-09-08 DIAGNOSIS — F419 Anxiety disorder, unspecified: Secondary | ICD-10-CM | POA: Diagnosis not present

## 2017-09-14 DIAGNOSIS — M47812 Spondylosis without myelopathy or radiculopathy, cervical region: Secondary | ICD-10-CM | POA: Diagnosis not present

## 2017-09-14 DIAGNOSIS — M542 Cervicalgia: Secondary | ICD-10-CM | POA: Diagnosis not present

## 2017-09-16 DIAGNOSIS — M47812 Spondylosis without myelopathy or radiculopathy, cervical region: Secondary | ICD-10-CM | POA: Diagnosis not present

## 2017-09-16 DIAGNOSIS — M542 Cervicalgia: Secondary | ICD-10-CM | POA: Diagnosis not present

## 2017-09-29 DIAGNOSIS — G4700 Insomnia, unspecified: Secondary | ICD-10-CM | POA: Diagnosis not present

## 2017-09-29 DIAGNOSIS — F439 Reaction to severe stress, unspecified: Secondary | ICD-10-CM | POA: Diagnosis not present

## 2017-09-29 DIAGNOSIS — M545 Low back pain: Secondary | ICD-10-CM | POA: Diagnosis not present

## 2017-09-29 DIAGNOSIS — K52832 Lymphocytic colitis: Secondary | ICD-10-CM | POA: Diagnosis not present

## 2017-10-29 DIAGNOSIS — K52832 Lymphocytic colitis: Secondary | ICD-10-CM | POA: Diagnosis not present

## 2017-11-10 DIAGNOSIS — F439 Reaction to severe stress, unspecified: Secondary | ICD-10-CM | POA: Diagnosis not present

## 2017-11-10 DIAGNOSIS — G4700 Insomnia, unspecified: Secondary | ICD-10-CM | POA: Diagnosis not present

## 2017-12-08 DIAGNOSIS — G4700 Insomnia, unspecified: Secondary | ICD-10-CM | POA: Diagnosis not present

## 2017-12-14 DIAGNOSIS — F419 Anxiety disorder, unspecified: Secondary | ICD-10-CM | POA: Diagnosis not present

## 2017-12-14 DIAGNOSIS — Z0000 Encounter for general adult medical examination without abnormal findings: Secondary | ICD-10-CM | POA: Diagnosis not present

## 2017-12-14 DIAGNOSIS — R197 Diarrhea, unspecified: Secondary | ICD-10-CM | POA: Diagnosis not present

## 2017-12-14 DIAGNOSIS — M4722 Other spondylosis with radiculopathy, cervical region: Secondary | ICD-10-CM | POA: Diagnosis not present

## 2017-12-14 DIAGNOSIS — Z1389 Encounter for screening for other disorder: Secondary | ICD-10-CM | POA: Diagnosis not present

## 2017-12-14 DIAGNOSIS — K52832 Lymphocytic colitis: Secondary | ICD-10-CM | POA: Diagnosis not present

## 2017-12-14 DIAGNOSIS — G4700 Insomnia, unspecified: Secondary | ICD-10-CM | POA: Diagnosis not present

## 2017-12-14 DIAGNOSIS — E781 Pure hyperglyceridemia: Secondary | ICD-10-CM | POA: Diagnosis not present

## 2017-12-14 DIAGNOSIS — Z1159 Encounter for screening for other viral diseases: Secondary | ICD-10-CM | POA: Diagnosis not present

## 2017-12-15 DIAGNOSIS — K52832 Lymphocytic colitis: Secondary | ICD-10-CM | POA: Diagnosis not present

## 2017-12-29 DIAGNOSIS — Z1231 Encounter for screening mammogram for malignant neoplasm of breast: Secondary | ICD-10-CM | POA: Diagnosis not present

## 2017-12-29 DIAGNOSIS — Z01419 Encounter for gynecological examination (general) (routine) without abnormal findings: Secondary | ICD-10-CM | POA: Diagnosis not present

## 2017-12-29 DIAGNOSIS — Z124 Encounter for screening for malignant neoplasm of cervix: Secondary | ICD-10-CM | POA: Diagnosis not present

## 2017-12-29 DIAGNOSIS — Z681 Body mass index (BMI) 19 or less, adult: Secondary | ICD-10-CM | POA: Diagnosis not present

## 2018-02-08 DIAGNOSIS — K52832 Lymphocytic colitis: Secondary | ICD-10-CM | POA: Diagnosis not present

## 2018-02-08 DIAGNOSIS — R197 Diarrhea, unspecified: Secondary | ICD-10-CM | POA: Diagnosis not present

## 2018-02-16 DIAGNOSIS — R197 Diarrhea, unspecified: Secondary | ICD-10-CM | POA: Diagnosis not present

## 2018-03-10 DIAGNOSIS — R197 Diarrhea, unspecified: Secondary | ICD-10-CM | POA: Diagnosis not present

## 2018-03-10 DIAGNOSIS — K52831 Collagenous colitis: Secondary | ICD-10-CM | POA: Diagnosis not present

## 2018-03-10 DIAGNOSIS — K640 First degree hemorrhoids: Secondary | ICD-10-CM | POA: Diagnosis not present

## 2018-03-15 DIAGNOSIS — K52831 Collagenous colitis: Secondary | ICD-10-CM | POA: Diagnosis not present

## 2018-03-21 DIAGNOSIS — M545 Low back pain: Secondary | ICD-10-CM | POA: Diagnosis not present

## 2018-03-22 DIAGNOSIS — M533 Sacrococcygeal disorders, not elsewhere classified: Secondary | ICD-10-CM | POA: Diagnosis not present

## 2018-03-22 DIAGNOSIS — M545 Low back pain: Secondary | ICD-10-CM | POA: Diagnosis not present

## 2018-03-29 DIAGNOSIS — M545 Low back pain: Secondary | ICD-10-CM | POA: Diagnosis not present

## 2018-03-29 DIAGNOSIS — M533 Sacrococcygeal disorders, not elsewhere classified: Secondary | ICD-10-CM | POA: Diagnosis not present

## 2018-03-31 DIAGNOSIS — M545 Low back pain: Secondary | ICD-10-CM | POA: Diagnosis not present

## 2018-03-31 DIAGNOSIS — M533 Sacrococcygeal disorders, not elsewhere classified: Secondary | ICD-10-CM | POA: Diagnosis not present

## 2018-04-04 DIAGNOSIS — K52831 Collagenous colitis: Secondary | ICD-10-CM | POA: Diagnosis not present

## 2018-04-05 DIAGNOSIS — M545 Low back pain: Secondary | ICD-10-CM | POA: Diagnosis not present

## 2018-04-05 DIAGNOSIS — M533 Sacrococcygeal disorders, not elsewhere classified: Secondary | ICD-10-CM | POA: Diagnosis not present

## 2018-04-07 DIAGNOSIS — M545 Low back pain: Secondary | ICD-10-CM | POA: Diagnosis not present

## 2018-04-07 DIAGNOSIS — M533 Sacrococcygeal disorders, not elsewhere classified: Secondary | ICD-10-CM | POA: Diagnosis not present

## 2018-04-12 DIAGNOSIS — M545 Low back pain: Secondary | ICD-10-CM | POA: Diagnosis not present

## 2018-04-12 DIAGNOSIS — M533 Sacrococcygeal disorders, not elsewhere classified: Secondary | ICD-10-CM | POA: Diagnosis not present

## 2018-04-19 DIAGNOSIS — D485 Neoplasm of uncertain behavior of skin: Secondary | ICD-10-CM | POA: Diagnosis not present

## 2018-04-19 DIAGNOSIS — C44629 Squamous cell carcinoma of skin of left upper limb, including shoulder: Secondary | ICD-10-CM | POA: Diagnosis not present

## 2018-04-19 DIAGNOSIS — L820 Inflamed seborrheic keratosis: Secondary | ICD-10-CM | POA: Diagnosis not present

## 2018-04-19 DIAGNOSIS — C4492 Squamous cell carcinoma of skin, unspecified: Secondary | ICD-10-CM

## 2018-04-19 HISTORY — DX: Squamous cell carcinoma of skin, unspecified: C44.92

## 2018-04-20 DIAGNOSIS — M533 Sacrococcygeal disorders, not elsewhere classified: Secondary | ICD-10-CM | POA: Diagnosis not present

## 2018-04-20 DIAGNOSIS — M545 Low back pain: Secondary | ICD-10-CM | POA: Diagnosis not present

## 2018-04-21 DIAGNOSIS — K52831 Collagenous colitis: Secondary | ICD-10-CM | POA: Diagnosis not present

## 2018-04-21 DIAGNOSIS — M533 Sacrococcygeal disorders, not elsewhere classified: Secondary | ICD-10-CM | POA: Diagnosis not present

## 2018-04-21 DIAGNOSIS — M545 Low back pain: Secondary | ICD-10-CM | POA: Diagnosis not present

## 2018-05-09 DIAGNOSIS — M25551 Pain in right hip: Secondary | ICD-10-CM | POA: Diagnosis not present

## 2018-05-09 DIAGNOSIS — M545 Low back pain: Secondary | ICD-10-CM | POA: Diagnosis not present

## 2018-05-30 DIAGNOSIS — M545 Low back pain: Secondary | ICD-10-CM | POA: Diagnosis not present

## 2018-06-03 DIAGNOSIS — M545 Low back pain: Secondary | ICD-10-CM | POA: Diagnosis not present

## 2018-06-14 DIAGNOSIS — M533 Sacrococcygeal disorders, not elsewhere classified: Secondary | ICD-10-CM | POA: Diagnosis not present

## 2018-06-16 DIAGNOSIS — C44622 Squamous cell carcinoma of skin of right upper limb, including shoulder: Secondary | ICD-10-CM | POA: Diagnosis not present

## 2018-06-23 DIAGNOSIS — G4700 Insomnia, unspecified: Secondary | ICD-10-CM | POA: Diagnosis not present

## 2018-07-05 DIAGNOSIS — M533 Sacrococcygeal disorders, not elsewhere classified: Secondary | ICD-10-CM | POA: Diagnosis not present

## 2018-08-05 DIAGNOSIS — M25511 Pain in right shoulder: Secondary | ICD-10-CM | POA: Diagnosis not present

## 2018-08-22 DIAGNOSIS — M542 Cervicalgia: Secondary | ICD-10-CM | POA: Diagnosis not present

## 2018-08-22 DIAGNOSIS — M25511 Pain in right shoulder: Secondary | ICD-10-CM | POA: Diagnosis not present

## 2018-08-30 DIAGNOSIS — M5116 Intervertebral disc disorders with radiculopathy, lumbar region: Secondary | ICD-10-CM | POA: Diagnosis not present

## 2018-09-06 DIAGNOSIS — M5116 Intervertebral disc disorders with radiculopathy, lumbar region: Secondary | ICD-10-CM | POA: Diagnosis not present

## 2018-09-08 DIAGNOSIS — M5116 Intervertebral disc disorders with radiculopathy, lumbar region: Secondary | ICD-10-CM | POA: Diagnosis not present

## 2018-09-13 DIAGNOSIS — M5416 Radiculopathy, lumbar region: Secondary | ICD-10-CM | POA: Diagnosis not present

## 2018-09-15 DIAGNOSIS — M5116 Intervertebral disc disorders with radiculopathy, lumbar region: Secondary | ICD-10-CM | POA: Diagnosis not present

## 2018-09-20 DIAGNOSIS — M5116 Intervertebral disc disorders with radiculopathy, lumbar region: Secondary | ICD-10-CM | POA: Diagnosis not present

## 2018-09-22 DIAGNOSIS — M5116 Intervertebral disc disorders with radiculopathy, lumbar region: Secondary | ICD-10-CM | POA: Diagnosis not present

## 2018-09-23 DIAGNOSIS — K52831 Collagenous colitis: Secondary | ICD-10-CM | POA: Diagnosis not present

## 2018-09-27 DIAGNOSIS — M5116 Intervertebral disc disorders with radiculopathy, lumbar region: Secondary | ICD-10-CM | POA: Diagnosis not present

## 2018-09-29 DIAGNOSIS — M5416 Radiculopathy, lumbar region: Secondary | ICD-10-CM | POA: Diagnosis not present

## 2018-10-10 DIAGNOSIS — M545 Low back pain: Secondary | ICD-10-CM | POA: Diagnosis not present

## 2018-10-17 DIAGNOSIS — M47816 Spondylosis without myelopathy or radiculopathy, lumbar region: Secondary | ICD-10-CM | POA: Diagnosis not present

## 2018-11-04 DIAGNOSIS — K52831 Collagenous colitis: Secondary | ICD-10-CM | POA: Diagnosis not present

## 2018-11-08 DIAGNOSIS — F324 Major depressive disorder, single episode, in partial remission: Secondary | ICD-10-CM | POA: Diagnosis not present

## 2018-11-08 DIAGNOSIS — F419 Anxiety disorder, unspecified: Secondary | ICD-10-CM | POA: Diagnosis not present

## 2018-11-15 DIAGNOSIS — M47816 Spondylosis without myelopathy or radiculopathy, lumbar region: Secondary | ICD-10-CM | POA: Diagnosis not present

## 2018-12-04 DIAGNOSIS — J069 Acute upper respiratory infection, unspecified: Secondary | ICD-10-CM | POA: Diagnosis not present

## 2018-12-08 DIAGNOSIS — F324 Major depressive disorder, single episode, in partial remission: Secondary | ICD-10-CM | POA: Diagnosis not present

## 2018-12-16 DIAGNOSIS — K52832 Lymphocytic colitis: Secondary | ICD-10-CM | POA: Diagnosis not present

## 2019-01-03 DIAGNOSIS — F324 Major depressive disorder, single episode, in partial remission: Secondary | ICD-10-CM | POA: Diagnosis not present

## 2019-01-03 DIAGNOSIS — K52831 Collagenous colitis: Secondary | ICD-10-CM | POA: Diagnosis not present

## 2019-01-03 DIAGNOSIS — F419 Anxiety disorder, unspecified: Secondary | ICD-10-CM | POA: Diagnosis not present

## 2019-01-03 DIAGNOSIS — Z0000 Encounter for general adult medical examination without abnormal findings: Secondary | ICD-10-CM | POA: Diagnosis not present

## 2019-01-10 DIAGNOSIS — G4700 Insomnia, unspecified: Secondary | ICD-10-CM | POA: Diagnosis not present

## 2019-01-31 DIAGNOSIS — R69 Illness, unspecified: Secondary | ICD-10-CM | POA: Diagnosis not present

## 2019-03-29 DIAGNOSIS — Z779 Other contact with and (suspected) exposures hazardous to health: Secondary | ICD-10-CM | POA: Diagnosis not present

## 2019-03-29 DIAGNOSIS — Z1231 Encounter for screening mammogram for malignant neoplasm of breast: Secondary | ICD-10-CM | POA: Diagnosis not present

## 2019-03-29 DIAGNOSIS — Z124 Encounter for screening for malignant neoplasm of cervix: Secondary | ICD-10-CM | POA: Diagnosis not present

## 2019-03-29 DIAGNOSIS — N958 Other specified menopausal and perimenopausal disorders: Secondary | ICD-10-CM | POA: Diagnosis not present

## 2019-03-29 DIAGNOSIS — Z13228 Encounter for screening for other metabolic disorders: Secondary | ICD-10-CM | POA: Diagnosis not present

## 2019-03-29 DIAGNOSIS — Z681 Body mass index (BMI) 19 or less, adult: Secondary | ICD-10-CM | POA: Diagnosis not present

## 2019-03-29 DIAGNOSIS — Z1321 Encounter for screening for nutritional disorder: Secondary | ICD-10-CM | POA: Diagnosis not present

## 2019-04-05 DIAGNOSIS — M25511 Pain in right shoulder: Secondary | ICD-10-CM | POA: Diagnosis not present

## 2019-04-14 DIAGNOSIS — K52831 Collagenous colitis: Secondary | ICD-10-CM | POA: Diagnosis not present

## 2019-07-11 DIAGNOSIS — G4700 Insomnia, unspecified: Secondary | ICD-10-CM | POA: Diagnosis not present

## 2019-07-12 DIAGNOSIS — L821 Other seborrheic keratosis: Secondary | ICD-10-CM | POA: Diagnosis not present

## 2019-07-12 DIAGNOSIS — D229 Melanocytic nevi, unspecified: Secondary | ICD-10-CM | POA: Diagnosis not present

## 2019-08-09 DIAGNOSIS — M47816 Spondylosis without myelopathy or radiculopathy, lumbar region: Secondary | ICD-10-CM | POA: Diagnosis not present

## 2019-10-05 DIAGNOSIS — L820 Inflamed seborrheic keratosis: Secondary | ICD-10-CM | POA: Diagnosis not present

## 2019-10-05 DIAGNOSIS — D229 Melanocytic nevi, unspecified: Secondary | ICD-10-CM | POA: Diagnosis not present

## 2019-11-20 DIAGNOSIS — L820 Inflamed seborrheic keratosis: Secondary | ICD-10-CM | POA: Diagnosis not present

## 2020-01-07 ENCOUNTER — Ambulatory Visit: Payer: PPO

## 2020-01-12 DIAGNOSIS — Z6821 Body mass index (BMI) 21.0-21.9, adult: Secondary | ICD-10-CM | POA: Diagnosis not present

## 2020-01-12 DIAGNOSIS — F419 Anxiety disorder, unspecified: Secondary | ICD-10-CM | POA: Diagnosis not present

## 2020-01-12 DIAGNOSIS — G4700 Insomnia, unspecified: Secondary | ICD-10-CM | POA: Diagnosis not present

## 2020-01-12 DIAGNOSIS — Z79899 Other long term (current) drug therapy: Secondary | ICD-10-CM | POA: Diagnosis not present

## 2020-01-12 DIAGNOSIS — K52832 Lymphocytic colitis: Secondary | ICD-10-CM | POA: Diagnosis not present

## 2020-01-12 DIAGNOSIS — Z0000 Encounter for general adult medical examination without abnormal findings: Secondary | ICD-10-CM | POA: Diagnosis not present

## 2020-01-12 DIAGNOSIS — K121 Other forms of stomatitis: Secondary | ICD-10-CM | POA: Diagnosis not present

## 2020-01-18 DIAGNOSIS — G4700 Insomnia, unspecified: Secondary | ICD-10-CM | POA: Diagnosis not present

## 2020-01-23 ENCOUNTER — Ambulatory Visit: Payer: Medicare Other

## 2020-03-11 DIAGNOSIS — M25511 Pain in right shoulder: Secondary | ICD-10-CM | POA: Diagnosis not present

## 2020-03-11 DIAGNOSIS — M461 Sacroiliitis, not elsewhere classified: Secondary | ICD-10-CM | POA: Diagnosis not present

## 2020-03-11 DIAGNOSIS — M545 Low back pain: Secondary | ICD-10-CM | POA: Diagnosis not present

## 2020-03-11 DIAGNOSIS — M1611 Unilateral primary osteoarthritis, right hip: Secondary | ICD-10-CM | POA: Diagnosis not present

## 2020-04-03 DIAGNOSIS — M25561 Pain in right knee: Secondary | ICD-10-CM | POA: Diagnosis not present

## 2020-04-08 DIAGNOSIS — M25511 Pain in right shoulder: Secondary | ICD-10-CM | POA: Diagnosis not present

## 2020-04-11 ENCOUNTER — Ambulatory Visit (INDEPENDENT_AMBULATORY_CARE_PROVIDER_SITE_OTHER): Payer: PPO

## 2020-04-11 ENCOUNTER — Ambulatory Visit: Payer: PPO | Admitting: Podiatry

## 2020-04-11 ENCOUNTER — Encounter: Payer: Self-pay | Admitting: Podiatry

## 2020-04-11 ENCOUNTER — Other Ambulatory Visit: Payer: Self-pay

## 2020-04-11 VITALS — Temp 98.0°F

## 2020-04-11 DIAGNOSIS — M2010 Hallux valgus (acquired), unspecified foot: Secondary | ICD-10-CM

## 2020-04-11 DIAGNOSIS — M722 Plantar fascial fibromatosis: Secondary | ICD-10-CM

## 2020-04-11 DIAGNOSIS — M79672 Pain in left foot: Secondary | ICD-10-CM

## 2020-04-11 DIAGNOSIS — M2011 Hallux valgus (acquired), right foot: Secondary | ICD-10-CM | POA: Diagnosis not present

## 2020-04-11 NOTE — Progress Notes (Signed)
   Subjective:    Patient ID: Whitney Johnson, female    DOB: 14-Nov-1951, 69 y.o.   MRN: [id number]  HPI    Review of Systems  All other systems reviewed and are negative.      Objective:   Physical Exam        Assessment & Plan:

## 2020-04-11 NOTE — Patient Instructions (Signed)
Bunion  A bunion is a bump on the base of the big toe that forms when the bones of the big toe joint move out of position. Bunions may be small at first, but they often get larger over time. They can make walking painful. What are the causes? A bunion may be caused by:  Wearing narrow or pointed shoes that force the big toe to press against the other toes.  Abnormal foot development that causes the foot to roll inward (pronate).  Changes in the foot that are caused by certain diseases, such as rheumatoid arthritis or polio.  A foot injury. What increases the risk? The following factors may make you more likely to develop this condition:  Wearing shoes that squeeze the toes together.  Having certain diseases, such as: ? Rheumatoid arthritis. ? Polio. ? Cerebral palsy.  Having family members who have bunions.  Being born with a foot deformity, such as flat feet or low arches.  Doing activities that put a lot of pressure on the feet, such as ballet dancing. What are the signs or symptoms? The main symptom of a bunion is a noticeable bump on the big toe. Other symptoms may include:  Pain.  Swelling around the big toe.  Redness and inflammation.  Thick or hardened skin on the big toe or between the toes.  Stiffness or loss of motion in the big toe.  Trouble with walking. How is this diagnosed? A bunion may be diagnosed based on your symptoms, medical history, and activities. You may have tests, such as:  X-rays. These allow your health care provider to check the position of the bones in your foot and look for damage to your joint. They also help your health care provider determine the severity of your bunion and the best way to treat it.  Joint aspiration. In this test, a sample of fluid is removed from the toe joint. This test may be done if you are in a lot of pain. It helps rule out diseases that cause painful swelling of the joints, such as arthritis. How is this  treated? Treatment depends on the severity of your symptoms. The goal of treatment is to relieve symptoms and prevent the bunion from getting worse. Your health care provider may recommend:  Wearing shoes that have a wide toe box.  Using bunion pads to cushion the affected area.  Taping your toes together to keep them in a normal position.  Placing a device inside your shoe (orthotics) to help reduce pressure on your toe joint.  Taking medicine to ease pain, inflammation, and swelling.  Applying heat or ice to the affected area.  Doing stretching exercises.  Surgery to remove scar tissue and move the toes back into their normal position. This treatment is rare. Follow these instructions at home: Managing pain, stiffness, and swelling   If directed, put ice on the painful area: ? Put ice in a plastic bag. ? Place a towel between your skin and the bag. ? Leave the ice on for 20 minutes, 2-3 times a day. Activity   If directed, apply heat to the affected area before you exercise. Use the heat source that your health care provider recommends, such as a moist heat pack or a heating pad. ? Place a towel between your skin and the heat source. ? Leave the heat on for 20-30 minutes. ? Remove the heat if your skin turns bright red. This is especially important if you are unable to feel pain,   heat, or cold. You may have a greater risk of getting burned.  Do exercises as told by your health care provider. General instructions  Support your toe joint with proper footwear, shoe padding, or taping as told by your health care provider.  Take over-the-counter and prescription medicines only as told by your health care provider.  Keep all follow-up visits as told by your health care provider. This is important. Contact a health care provider if your symptoms:  Get worse.  Do not improve in 2 weeks. Get help right away if you have:  Severe pain and trouble with walking. Summary  A  bunion is a bump on the base of the big toe that forms when the bones of the big toe joint move out of position.  Bunions can make walking painful.  Treatment depends on the severity of your symptoms.  Support your toe joint with proper footwear, shoe padding, or taping as told by your health care provider. This information is not intended to replace advice given to you by your health care provider. Make sure you discuss any questions you have with your health care provider. Document Revised: 05/23/2018 Document Reviewed: 03/29/2018 Elsevier Patient Education  [place].    Plantar Fasciitis (Heel Spur Syndrome) with Rehab The plantar fascia is a fibrous, ligament-like, soft-tissue structure that spans the bottom of the foot. Plantar fasciitis is a condition that causes pain in the foot due to inflammation of the tissue. SYMPTOMS   Pain and tenderness on the underneath side of the foot.  Pain that worsens with standing or walking. CAUSES  Plantar fasciitis is caused by irritation and injury to the plantar fascia on the underneath side of the foot. Common mechanisms of injury include:  Direct trauma to bottom of the foot.  Damage to a small nerve that runs under the foot where the main fascia attaches to the heel bone.  Stress placed on the plantar fascia due to bone spurs. RISK INCREASES WITH:   Activities that place stress on the plantar fascia (running, jumping, pivoting, or cutting).  Poor strength and flexibility.  Improperly fitted shoes.  Tight calf muscles.  Flat feet.  Failure to warm-up properly before activity.  Obesity. PREVENTION  Warm up and stretch properly before activity.  Allow for adequate recovery between workouts.  Maintain physical fitness:  Strength, flexibility, and endurance.  Cardiovascular fitness.  Maintain a health body weight.  Avoid stress on the plantar fascia.  Wear properly fitted shoes, including arch supports for  individuals who have flat feet.  PROGNOSIS  If treated properly, then the symptoms of plantar fasciitis usually resolve without surgery. However, occasionally surgery is necessary.  RELATED COMPLICATIONS   Recurrent symptoms that may result in a chronic condition.  Problems of the lower back that are caused by compensating for the injury, such as limping.  Pain or weakness of the foot during push-off following surgery.  Chronic inflammation, scarring, and partial or complete fascia tear, occurring more often from repeated injections.  TREATMENT  Treatment initially involves the use of ice and medication to help reduce pain and inflammation. The use of strengthening and stretching exercises may help reduce pain with activity, especially stretches of the Achilles tendon. These exercises may be performed at home or with a therapist. Your caregiver may recommend that you use heel cups of arch supports to help reduce stress on the plantar fascia. Occasionally, corticosteroid injections are given to reduce inflammation. If symptoms persist for greater than 6 months  despite non-surgical (conservative), then surgery may be recommended.   MEDICATION   If pain medication is necessary, then nonsteroidal anti-inflammatory medications, such as aspirin and ibuprofen, or other minor pain relievers, such as acetaminophen, are often recommended.  Do not take pain medication within 7 days before surgery.  Prescription pain relievers may be given if deemed necessary by your caregiver. Use only as directed and only as much as you need.  Corticosteroid injections may be given by your caregiver. These injections should be reserved for the most serious cases, because they may only be given a certain number of times.  HEAT AND COLD  Cold treatment (icing) relieves pain and reduces inflammation. Cold treatment should be applied for 10 to 15 minutes every 2 to 3 hours for inflammation and pain and immediately  after any activity that aggravates your symptoms. Use ice packs or massage the area with a piece of ice (ice massage).  Heat treatment may be used prior to performing the stretching and strengthening activities prescribed by your caregiver, physical therapist, or athletic trainer. Use a heat pack or soak the injury in warm water.  SEEK IMMEDIATE MEDICAL CARE IF:  Treatment seems to offer no benefit, or the condition worsens.  Any medications produce adverse side effects.  EXERCISES- RANGE OF MOTION (ROM) AND STRETCHING EXERCISES - Plantar Fasciitis (Heel Spur Syndrome) These exercises may help you when beginning to rehabilitate your injury. Your symptoms may resolve with or without further involvement from your physician, physical therapist or athletic trainer. While completing these exercises, remember:   Restoring tissue flexibility helps normal motion to return to the joints. This allows healthier, less painful movement and activity.  An effective stretch should be held for at least 30 seconds.  A stretch should never be painful. You should only feel a gentle lengthening or release in the stretched tissue.  RANGE OF MOTION - Toe Extension, Flexion  Sit with your right / left leg crossed over your opposite knee.  Grasp your toes and gently pull them back toward the top of your foot. You should feel a stretch on the bottom of your toes and/or foot.  Hold this stretch for 10 seconds.  Now, gently pull your toes toward the bottom of your foot. You should feel a stretch on the top of your toes and or foot.  Hold this stretch for 10 seconds. Repeat  times. Complete this stretch 3 times per day.   RANGE OF MOTION - Ankle Dorsiflexion, Active Assisted  Remove shoes and sit on a chair that is preferably not on a carpeted surface.  Place right / left foot under knee. Extend your opposite leg for support.  Keeping your heel down, slide your right / left foot back toward the chair until  you feel a stretch at your ankle or calf. If you do not feel a stretch, slide your bottom forward to the edge of the chair, while still keeping your heel down.  Hold this stretch for 10 seconds. Repeat 3 times. Complete this stretch 2 times per day.   STRETCH  Gastroc, Standing  Place hands on wall.  Extend right / left leg, keeping the front knee somewhat bent.  Slightly point your toes inward on your back foot.  Keeping your right / left heel on the floor and your knee straight, shift your weight toward the wall, not allowing your back to arch.  You should feel a gentle stretch in the right / left calf. Hold this position for 10  seconds. Repeat 3 times. Complete this stretch 2 times per day.  STRETCH  Soleus, Standing  Place hands on wall.  Extend right / left leg, keeping the other knee somewhat bent.  Slightly point your toes inward on your back foot.  Keep your right / left heel on the floor, bend your back knee, and slightly shift your weight over the back leg so that you feel a gentle stretch deep in your back calf.  Hold this position for 10 seconds. Repeat 3 times. Complete this stretch 2 times per day.  STRETCH  Gastrocsoleus, Standing  Note: This exercise can place a lot of stress on your foot and ankle. Please complete this exercise only if specifically instructed by your caregiver.   Place the ball of your right / left foot on a step, keeping your other foot firmly on the same step.  Hold on to the wall or a rail for balance.  Slowly lift your other foot, allowing your body weight to press your heel down over the edge of the step.  You should feel a stretch in your right / left calf.  Hold this position for 10 seconds.  Repeat this exercise with a slight bend in your right / left knee. Repeat 3 times. Complete this stretch 2 times per day.   STRENGTHENING EXERCISES - Plantar Fasciitis (Heel Spur Syndrome)  These exercises may help you when beginning to  rehabilitate your injury. They may resolve your symptoms with or without further involvement from your physician, physical therapist or athletic trainer. While completing these exercises, remember:   Muscles can gain both the endurance and the strength needed for everyday activities through controlled exercises.  Complete these exercises as instructed by your physician, physical therapist or athletic trainer. Progress the resistance and repetitions only as guided.  STRENGTH - Towel Curls  Sit in a chair positioned on a non-carpeted surface.  Place your foot on a towel, keeping your heel on the floor.  Pull the towel toward your heel by only curling your toes. Keep your heel on the floor. Repeat 3 times. Complete this exercise 2 times per day.  STRENGTH - Ankle Inversion  Secure one end of a rubber exercise band/tubing to a fixed object (table, pole). Loop the other end around your foot just before your toes.  Place your fists between your knees. This will focus your strengthening at your ankle.  Slowly, pull your big toe up and in, making sure the band/tubing is positioned to resist the entire motion.  Hold this position for 10 seconds.  Have your muscles resist the band/tubing as it slowly pulls your foot back to the starting position. Repeat 3 times. Complete this exercises 2 times per day.  Document Released: 11/16/2005 Document Revised: 02/08/2012 Document Reviewed: 02/28/2009 [facility] Patient Information 2014 [place], [place].

## 2020-04-12 NOTE — Progress Notes (Signed)
Subjective:   Patient ID: Whitney Johnson, female   DOB: 69 y.o.   MRN: [id number]   HPI Patient presents stating she has had a painful bunion of her right foot that is been going on for a long time and also has developed pain in the bottom of the left heel over the last couple months.  Patient is trying to avoid surgery if possible but knows it may be necessary someday and is interested in what would be necessary.  Patient does not smoke likes to be active is in good health   Review of Systems  All other systems reviewed and are negative.       Objective:  Physical Exam Vitals and nursing note reviewed.  Constitutional:      Appearance: She is well-developed.  Pulmonary:     Effort: Pulmonary effort is normal.  Musculoskeletal:        General: Normal range of motion.  Skin:    General: Skin is warm.  Neurological:     Mental Status: She is alert.     Neurovascular status was found to be intact muscle strength was found to be adequate.  Range of motion adequate subtalar midtarsal joint with moderate loss of motion first MPJ right with hyperostosis and redness of the first metatarsal head right.  She is tried wider shoes padding of the area without relief of symptoms and also is noted on the plantar left heel to have inflammation pain at the insertional point tendon calcaneus.  Moderate depression of the arch is noted.  Patient has good digital perfusion well oriented x3     Assessment:  Significant structural bunion deformity right with plantar fascial inflammation left     Plan:  H&P reviewed conditions and discussed bunion and recommended at 1 point distal surgery along with shortening osteotomy due to loss of motion.  For the left I did do sterile prep injected the fascia 3 mg Kenalog 5 mg Xylocaine educated her on physical therapy shoe gear modifications and patient will be seen back and at one point future I do think she will be a good candidate for distal correction  right  X-rays indicate she does have moderate elevation of the intermetatarsal angle right but also has elongation of the segment with indications of hallux limitus deformity with small spur formation left

## 2020-04-24 DIAGNOSIS — Z01419 Encounter for gynecological examination (general) (routine) without abnormal findings: Secondary | ICD-10-CM | POA: Diagnosis not present

## 2020-04-24 DIAGNOSIS — Z6820 Body mass index (BMI) 20.0-20.9, adult: Secondary | ICD-10-CM | POA: Diagnosis not present

## 2020-04-24 DIAGNOSIS — Z1231 Encounter for screening mammogram for malignant neoplasm of breast: Secondary | ICD-10-CM | POA: Diagnosis not present

## 2020-04-24 DIAGNOSIS — Z124 Encounter for screening for malignant neoplasm of cervix: Secondary | ICD-10-CM | POA: Diagnosis not present

## 2020-05-01 DIAGNOSIS — H35352 Cystoid macular degeneration, left eye: Secondary | ICD-10-CM | POA: Diagnosis not present

## 2020-05-01 DIAGNOSIS — H442A1 Degenerative myopia with choroidal neovascularization, right eye: Secondary | ICD-10-CM | POA: Diagnosis not present

## 2020-05-20 DIAGNOSIS — K117 Disturbances of salivary secretion: Secondary | ICD-10-CM | POA: Diagnosis not present

## 2020-08-07 DIAGNOSIS — R87610 Atypical squamous cells of undetermined significance on cytologic smear of cervix (ASC-US): Secondary | ICD-10-CM | POA: Diagnosis not present

## 2020-08-07 DIAGNOSIS — Z779 Other contact with and (suspected) exposures hazardous to health: Secondary | ICD-10-CM | POA: Diagnosis not present

## 2020-08-19 DIAGNOSIS — G4700 Insomnia, unspecified: Secondary | ICD-10-CM | POA: Diagnosis not present

## 2020-09-17 DIAGNOSIS — H47021 Hemorrhage in optic nerve sheath, right eye: Secondary | ICD-10-CM | POA: Diagnosis not present

## 2020-10-15 DIAGNOSIS — S61011A Laceration without foreign body of right thumb without damage to nail, initial encounter: Secondary | ICD-10-CM | POA: Diagnosis not present

## 2020-10-15 DIAGNOSIS — W260XXA Contact with knife, initial encounter: Secondary | ICD-10-CM | POA: Diagnosis not present

## 2020-10-21 DIAGNOSIS — M722 Plantar fascial fibromatosis: Secondary | ICD-10-CM | POA: Diagnosis not present

## 2020-10-21 DIAGNOSIS — M79672 Pain in left foot: Secondary | ICD-10-CM | POA: Diagnosis not present

## 2021-01-14 DIAGNOSIS — Z0000 Encounter for general adult medical examination without abnormal findings: Secondary | ICD-10-CM | POA: Diagnosis not present

## 2021-01-14 DIAGNOSIS — F419 Anxiety disorder, unspecified: Secondary | ICD-10-CM | POA: Diagnosis not present

## 2021-01-14 DIAGNOSIS — G4700 Insomnia, unspecified: Secondary | ICD-10-CM | POA: Diagnosis not present

## 2021-01-14 DIAGNOSIS — K52831 Collagenous colitis: Secondary | ICD-10-CM | POA: Diagnosis not present

## 2021-01-14 DIAGNOSIS — Z6821 Body mass index (BMI) 21.0-21.9, adult: Secondary | ICD-10-CM | POA: Diagnosis not present

## 2021-01-20 DIAGNOSIS — M79672 Pain in left foot: Secondary | ICD-10-CM | POA: Diagnosis not present

## 2021-01-27 DIAGNOSIS — M722 Plantar fascial fibromatosis: Secondary | ICD-10-CM | POA: Diagnosis not present

## 2021-01-27 DIAGNOSIS — M25672 Stiffness of left ankle, not elsewhere classified: Secondary | ICD-10-CM | POA: Diagnosis not present

## 2021-01-30 DIAGNOSIS — K52832 Lymphocytic colitis: Secondary | ICD-10-CM | POA: Diagnosis not present

## 2021-02-18 ENCOUNTER — Ambulatory Visit: Payer: PPO | Admitting: Dermatology

## 2021-02-18 ENCOUNTER — Other Ambulatory Visit: Payer: Self-pay

## 2021-02-18 ENCOUNTER — Encounter: Payer: Self-pay | Admitting: Dermatology

## 2021-02-18 DIAGNOSIS — L820 Inflamed seborrheic keratosis: Secondary | ICD-10-CM | POA: Diagnosis not present

## 2021-02-18 DIAGNOSIS — Z8589 Personal history of malignant neoplasm of other organs and systems: Secondary | ICD-10-CM

## 2021-02-18 DIAGNOSIS — Z85828 Personal history of other malignant neoplasm of skin: Secondary | ICD-10-CM | POA: Diagnosis not present

## 2021-02-18 DIAGNOSIS — L570 Actinic keratosis: Secondary | ICD-10-CM

## 2021-02-18 DIAGNOSIS — Z1283 Encounter for screening for malignant neoplasm of skin: Secondary | ICD-10-CM

## 2021-02-18 DIAGNOSIS — D485 Neoplasm of uncertain behavior of skin: Secondary | ICD-10-CM | POA: Diagnosis not present

## 2021-02-18 NOTE — Patient Instructions (Signed)

## 2021-02-19 DIAGNOSIS — G4700 Insomnia, unspecified: Secondary | ICD-10-CM | POA: Diagnosis not present

## 2021-02-26 DIAGNOSIS — M722 Plantar fascial fibromatosis: Secondary | ICD-10-CM | POA: Diagnosis not present

## 2021-02-26 DIAGNOSIS — M25672 Stiffness of left ankle, not elsewhere classified: Secondary | ICD-10-CM | POA: Diagnosis not present

## 2021-03-01 ENCOUNTER — Encounter: Payer: Self-pay | Admitting: Dermatology

## 2021-03-01 NOTE — Progress Notes (Signed)
   Follow-Up Visit   Subjective  Whitney Johnson is a 70 y.o. female who presents for the following: Annual Exam (Yearly exam).  General skin check, new spot on left leg Location:  Duration:  Quality:  Associated Signs/Symptoms: Modifying Factors:  Severity:  Timing: Context:   Objective  Well appearing patient in no apparent distress; mood and affect are within normal limits. Objective  Head to toe:  Full body skin check entheses areas beneath undergarments not fully examined): No atypical pigmented spots.  Possible superficial carcinoma left anterior thigh.  Objective  Left Thigh - Anterior: Pink waxy flat pink 7 mm crust: Rule out superficial carcinoma       Objective  Left Shoulder - Anterior, Left Upper Back: Hornlike 3 to 6 mm pink crusts  Objective  Left Preauricular Area: Pink flattopped 6 mm textured papule  Objective  Left Upper Arm - Anterior: Scar clear    A full examination was performed including scalp, head, eyes, ears, nose, lips, neck, chest, axillae, abdomen, back, buttocks, bilateral upper extremities, bilateral lower extremities, hands, feet, fingers, toes, fingernails, and toenails. All findings within normal limits unless otherwise noted below.  Areas beneath undergarments not fully examined.   Assessment & Plan    Screening exam for skin cancer Head to toe  Yearly skin check, self examine twice annually.  Encouraged continued ultraviolet protection.  Neoplasm of uncertain behavior of skin Left Thigh - Anterior  Skin / nail biopsy Type of biopsy: tangential   Informed consent: discussed and consent obtained   Timeout: patient name, date of birth, surgical site, and procedure verified   Procedure prep:  Patient was prepped and draped in usual sterile fashion (Non sterile) Prep type:  Chlorhexidine Anesthesia: the lesion was anesthetized in a standard fashion   Anesthetic:  1% lidocaine w/ epinephrine 1-100,000 local  infiltration Instrument used: flexible razor blade   Hemostasis achieved with: ferric subsulfate   Outcome: patient tolerated procedure well   Post-procedure details: sterile dressing applied and wound care instructions given   Dressing type: bandage and petrolatum    Specimen 1 - Surgical pathology Differential Diagnosis: R/O BCC vs SCC  Check Margins: No  AK (actinic keratosis) (2) Left Shoulder - Anterior; Left Upper Back  Destruction of lesion - Left Shoulder - Anterior, Left Upper Back Complexity: simple   Destruction method: cryotherapy   Informed consent: discussed and consent obtained   Timeout:  patient name, date of birth, surgical site, and procedure verified Lesion destroyed using liquid nitrogen: Yes   Cryotherapy cycles:  5 Outcome: patient tolerated procedure well with no complications   Post-procedure details: wound care instructions given    Inflamed seborrheic keratosis Left Preauricular Area  Destruction of lesion - Left Preauricular Area Complexity: simple   Destruction method: cryotherapy   Informed consent: discussed and consent obtained   Timeout:  patient name, date of birth, surgical site, and procedure verified Lesion destroyed using liquid nitrogen: Yes   Cryotherapy cycles:  5 Outcome: patient tolerated procedure well with no complications   Post-procedure details: wound care instructions given    History of squamous cell carcinoma Left Upper Arm - Anterior  Yearly skin check      I, Lavonna Monarch, MD, have reviewed all documentation for this visit.  The documentation on 03/01/21 for the exam, diagnosis, procedures, and orders are all accurate and complete.

## 2021-03-25 ENCOUNTER — Telehealth: Payer: Self-pay | Admitting: Dermatology

## 2021-03-25 NOTE — Telephone Encounter (Signed)
Path to patient no follow up needed on lesion

## 2021-03-25 NOTE — Telephone Encounter (Signed)
Patient left message on office voice mail that she was calling for pathology results from last visit with Lavonna Monarch, MD.

## 2021-05-12 DIAGNOSIS — H52203 Unspecified astigmatism, bilateral: Secondary | ICD-10-CM | POA: Diagnosis not present

## 2021-05-12 DIAGNOSIS — Z961 Presence of intraocular lens: Secondary | ICD-10-CM | POA: Diagnosis not present

## 2021-05-12 DIAGNOSIS — H35352 Cystoid macular degeneration, left eye: Secondary | ICD-10-CM | POA: Diagnosis not present

## 2021-06-05 DIAGNOSIS — Z1231 Encounter for screening mammogram for malignant neoplasm of breast: Secondary | ICD-10-CM | POA: Diagnosis not present

## 2021-06-05 DIAGNOSIS — N958 Other specified menopausal and perimenopausal disorders: Secondary | ICD-10-CM | POA: Diagnosis not present

## 2021-06-05 DIAGNOSIS — Z6820 Body mass index (BMI) 20.0-20.9, adult: Secondary | ICD-10-CM | POA: Diagnosis not present

## 2021-06-05 DIAGNOSIS — Z01419 Encounter for gynecological examination (general) (routine) without abnormal findings: Secondary | ICD-10-CM | POA: Diagnosis not present

## 2021-07-31 DIAGNOSIS — G4700 Insomnia, unspecified: Secondary | ICD-10-CM | POA: Diagnosis not present

## 2021-08-22 DIAGNOSIS — R682 Dry mouth, unspecified: Secondary | ICD-10-CM | POA: Diagnosis not present

## 2021-08-22 DIAGNOSIS — R350 Frequency of micturition: Secondary | ICD-10-CM | POA: Diagnosis not present

## 2021-08-22 DIAGNOSIS — R253 Fasciculation: Secondary | ICD-10-CM | POA: Diagnosis not present

## 2021-12-04 ENCOUNTER — Ambulatory Visit: Payer: PPO | Admitting: Neurology

## 2022-01-13 DIAGNOSIS — E781 Pure hyperglyceridemia: Secondary | ICD-10-CM | POA: Diagnosis not present

## 2022-01-14 DIAGNOSIS — M25551 Pain in right hip: Secondary | ICD-10-CM | POA: Diagnosis not present

## 2022-01-14 DIAGNOSIS — M25512 Pain in left shoulder: Secondary | ICD-10-CM | POA: Diagnosis not present

## 2022-01-14 DIAGNOSIS — M545 Low back pain, unspecified: Secondary | ICD-10-CM | POA: Diagnosis not present

## 2022-01-19 DIAGNOSIS — Z1389 Encounter for screening for other disorder: Secondary | ICD-10-CM | POA: Diagnosis not present

## 2022-01-19 DIAGNOSIS — Z87891 Personal history of nicotine dependence: Secondary | ICD-10-CM | POA: Diagnosis not present

## 2022-01-19 DIAGNOSIS — E7800 Pure hypercholesterolemia, unspecified: Secondary | ICD-10-CM | POA: Diagnosis not present

## 2022-01-19 DIAGNOSIS — Z0000 Encounter for general adult medical examination without abnormal findings: Secondary | ICD-10-CM | POA: Diagnosis not present

## 2022-01-20 ENCOUNTER — Other Ambulatory Visit: Payer: Self-pay | Admitting: Family Medicine

## 2022-01-20 DIAGNOSIS — E7800 Pure hypercholesterolemia, unspecified: Secondary | ICD-10-CM

## 2022-01-21 DIAGNOSIS — M6281 Muscle weakness (generalized): Secondary | ICD-10-CM | POA: Diagnosis not present

## 2022-01-21 DIAGNOSIS — M2569 Stiffness of other specified joint, not elsewhere classified: Secondary | ICD-10-CM | POA: Diagnosis not present

## 2022-01-21 DIAGNOSIS — M545 Low back pain, unspecified: Secondary | ICD-10-CM | POA: Diagnosis not present

## 2022-01-26 ENCOUNTER — Encounter: Payer: Self-pay | Admitting: Neurology

## 2022-01-26 ENCOUNTER — Ambulatory Visit: Payer: PPO | Admitting: Neurology

## 2022-01-26 VITALS — BP 114/76 | HR 79 | Ht 65.0 in | Wt 121.0 lb

## 2022-01-26 DIAGNOSIS — F419 Anxiety disorder, unspecified: Secondary | ICD-10-CM | POA: Insufficient documentation

## 2022-01-26 DIAGNOSIS — F439 Reaction to severe stress, unspecified: Secondary | ICD-10-CM | POA: Insufficient documentation

## 2022-01-26 DIAGNOSIS — G4700 Insomnia, unspecified: Secondary | ICD-10-CM | POA: Insufficient documentation

## 2022-01-26 DIAGNOSIS — R682 Dry mouth, unspecified: Secondary | ICD-10-CM

## 2022-01-26 DIAGNOSIS — E781 Pure hyperglyceridemia: Secondary | ICD-10-CM | POA: Insufficient documentation

## 2022-01-26 DIAGNOSIS — R7309 Other abnormal glucose: Secondary | ICD-10-CM | POA: Diagnosis not present

## 2022-01-26 DIAGNOSIS — R259 Unspecified abnormal involuntary movements: Secondary | ICD-10-CM | POA: Insufficient documentation

## 2022-01-26 DIAGNOSIS — D7589 Other specified diseases of blood and blood-forming organs: Secondary | ICD-10-CM | POA: Insufficient documentation

## 2022-01-26 DIAGNOSIS — J310 Chronic rhinitis: Secondary | ICD-10-CM | POA: Insufficient documentation

## 2022-01-26 DIAGNOSIS — Q211 Atrial septal defect, unspecified: Secondary | ICD-10-CM | POA: Insufficient documentation

## 2022-01-26 DIAGNOSIS — E7800 Pure hypercholesterolemia, unspecified: Secondary | ICD-10-CM | POA: Insufficient documentation

## 2022-01-26 DIAGNOSIS — K52831 Collagenous colitis: Secondary | ICD-10-CM | POA: Insufficient documentation

## 2022-01-26 DIAGNOSIS — M47812 Spondylosis without myelopathy or radiculopathy, cervical region: Secondary | ICD-10-CM | POA: Insufficient documentation

## 2022-01-26 DIAGNOSIS — Z87891 Personal history of nicotine dependence: Secondary | ICD-10-CM | POA: Insufficient documentation

## 2022-01-26 DIAGNOSIS — I359 Nonrheumatic aortic valve disorder, unspecified: Secondary | ICD-10-CM | POA: Insufficient documentation

## 2022-01-26 DIAGNOSIS — F324 Major depressive disorder, single episode, in partial remission: Secondary | ICD-10-CM | POA: Insufficient documentation

## 2022-01-26 NOTE — Progress Notes (Signed)
Whitney Johnson wants to make sure  Chief Complaint  Patient presents with   New Patient (Initial Visit)    Rm 14, alone NP/Paper Proficient/Lisa Sabra Heck MD Whitney Johnson at [facility] Fasciculations/ Report dry, numbness in tongue and month, some swelling in month, states these sx started 3 years ago       Whitney Johnson is a 71 y.o. female   Long history of chronic dry mouth  Laboratory evaluations to rule out Sjogren's disease, B12 deficiency, diabetes  Normal neurological examinations, discussed with patient, will hold off imaging study at this point   DIAGNOSTIC DATA (LABS, IMAGING, TESTING) - I reviewed patient records, labs, notes, testing and imaging myself where available.  Laboratory evaluation September 2022, 19 rheumatoid factor, normal TSH, ESR, ANA, CMP with mild elevated glucose of 115, CBC with hemoglobin of 15.2, CPK of 91  MEDICAL HISTORY:  Whitney Johnson is a 71 year old female, seen in request by her primary care doctor Whitney Johnson, for evaluation of dry mouth, initial evaluation was on January 26, 2021,  I reviewed and summarized the referring note. PMHX. Chronic insomnia, Ambien CR 6.$RemoveBef'25mg'rsSkqPwpfg$  qhs since 2010.  She had a history of chronic insomnia, has been under the care of Dr. Elenore Rota taking Ambien CR 12.5 mg every night, now trying to go on lower dose 6.25 mg every night, seems to works okay for her insomnia  She also had a history of lymphocytic colitis, taking budesonide 3 mg every night, symptoms overall under good control  Around 2020, she began to experience dry mouth, especially at nighttime, seems to affecting her taste of food, denied loss sense of smell, she felt her tongue sticks to the roof of her mouth, she denies significant dry eyes, denies dysarthria, no dysphagia,  She does workout regularly, also work as a Psychologist, occupational at the hospital, denies gait abnormality,    PHYSICAL EXAM:   Vitals:   01/26/22  0820  BP: 114/76  Pulse: 79  Weight: 121 lb (54.9 kg)  Height: $Remove'5\' 5"'mfJIzTE$  (1.651 m)   Not recorded     Body mass index is 20.14 kg/m.  PHYSICAL EXAMNIATION:  Gen: NAD, conversant, well nourised, well groomed                     Cardiovascular: Regular rate rhythm, no peripheral edema, warm, nontender. Eyes: Conjunctivae clear without exudates or hemorrhage Neck: Supple, no carotid bruits. Pulmonary: Clear to auscultation bilaterally   NEUROLOGICAL EXAM:  MENTAL STATUS: Speech:    Speech is normal; fluent and spontaneous with normal comprehension.  Cognition:     Orientation to time, place and person     Normal recent and remote memory     Normal Attention span and concentration     Normal Language, naming, repeating,spontaneous speech     Fund of knowledge   CRANIAL NERVES: CN II: Visual fields are full to confrontation. Pupils are round equal and briskly reactive to light. CN III, IV, VI: extraocular movement are normal. No ptosis. CN V: Facial sensation is intact to light touch CN VII: Face is symmetric with normal eye closure  CN VIII: Hearing is normal to causal conversation. CN IX, X: Phonation is normal. CN XI: Head turning and shoulder shrug are intact  MOTOR: There is no pronator drift of out-stretched arms. Muscle bulk and tone are normal. Muscle strength is normal.  REFLEXES: Reflexes are 2+ and symmetric at the biceps, triceps, knees, and ankles. Plantar responses  are flexor.  SENSORY: Intact to light touch, pinprick and vibratory sensation are intact in fingers and toes.  COORDINATION: There is no trunk or limb dysmetria noted.  GAIT/STANCE: Posture is normal. Gait is steady with normal steps, base, arm swing, and turning. Heel and toe walking are normal. Tandem gait is normal.  Romberg is absent.  REVIEW OF SYSTEMS:  Full 14 system review of systems performed and notable only for as above All other review of systems were  negative.   ALLERGIES: Allergies  Allergen Reactions   Lexapro [Escitalopram] Other (See Comments)    HOME MEDICATIONS: Current Outpatient Medications  Medication Sig Dispense Refill   budesonide (ENTOCORT EC) 3 MG 24 hr capsule Take 3 mg by mouth at bedtime.     fluticasone (VERAMYST) 27.5 MCG/SPRAY nasal spray Place 2 sprays into the nose daily.     zolpidem (AMBIEN CR) 6.25 MG CR tablet Take 12.5 mg by mouth at bedtime as needed.     No current facility-administered medications for this visit.    PAST MEDICAL HISTORY: Past Medical History:  Diagnosis Date   Environmental and seasonal allergies    Squamous cell carcinoma of skin 04/19/2018   left upper arm - CX3 + 5FU    PAST SURGICAL HISTORY: Past Surgical History:  Procedure Laterality Date   APPENDECTOMY  1958   heart vavle  1954    FAMILY HISTORY: History reviewed. No pertinent family history.  SOCIAL HISTORY: Social History   Socioeconomic History   Marital status: Divorced    Spouse name: Not on file   Number of children: 1   Years of education: Not on file   Highest education level: Not on file  Occupational History   Not on file  Tobacco Use   Smoking status: Former    Packs/day: 0.50    Types: Cigarettes   Smokeless tobacco: Never  Vaping Use   Vaping Use: Never used  Substance and Sexual Activity   Alcohol use: Yes    Alcohol/week: 0.0 standard drinks   Drug use: No   Sexual activity: Not on file  Other Topics Concern   Not on file  Social History Narrative   Not on file   Social Determinants of Health   Financial Resource Strain: Not on file  Food Insecurity: Not on file  Transportation Needs: Not on file  Physical Activity: Not on file  Stress: Not on file  Social Connections: Not on file  Intimate Partner Violence: Not on file      Whitney Johnson, M.D. Ph.D.  [facility] [address] [place], [place] [address] Ph: [phone] Fax:  [phone]  CC:  Whitney Lass, MD [place],  [place] [id number]  Whitney Lass, MD Re:

## 2022-01-27 DIAGNOSIS — M545 Low back pain, unspecified: Secondary | ICD-10-CM | POA: Diagnosis not present

## 2022-01-27 DIAGNOSIS — M2569 Stiffness of other specified joint, not elsewhere classified: Secondary | ICD-10-CM | POA: Diagnosis not present

## 2022-01-27 DIAGNOSIS — M6281 Muscle weakness (generalized): Secondary | ICD-10-CM | POA: Diagnosis not present

## 2022-01-27 LAB — SJOGRENS SYNDROME-A EXTRACTABLE NUCLEAR ANTIBODY: ENA SSA (RO) Ab: <0.2 AI (ref 0.0–0.9)

## 2022-01-27 LAB — HEMOGLOBIN A1C
Est. average glucose Bld gHb Est-mCnc: 108 mg/dL
Hgb A1c MFr Bld: 5.4 % (ref 4.8–5.6)

## 2022-01-27 LAB — VITAMIN B12: Vitamin B-12: 415 pg/mL (ref 232–1245)

## 2022-01-27 LAB — SJOGRENS SYNDROME-B EXTRACTABLE NUCLEAR ANTIBODY: ENA SSB (LA) Ab: <0.2 AI (ref 0.0–0.9)

## 2022-01-29 ENCOUNTER — Telehealth: Payer: Self-pay | Admitting: Neurology

## 2022-01-29 DIAGNOSIS — M545 Low back pain, unspecified: Secondary | ICD-10-CM | POA: Diagnosis not present

## 2022-01-29 DIAGNOSIS — G4700 Insomnia, unspecified: Secondary | ICD-10-CM | POA: Diagnosis not present

## 2022-01-29 DIAGNOSIS — M6281 Muscle weakness (generalized): Secondary | ICD-10-CM | POA: Diagnosis not present

## 2022-01-29 DIAGNOSIS — M2569 Stiffness of other specified joint, not elsewhere classified: Secondary | ICD-10-CM | POA: Diagnosis not present

## 2022-01-29 NOTE — Telephone Encounter (Signed)
I called the patient. She was able to see her normal lab results and wanted to know next step. Per vo by Dr. Krista Blue, neurological etiology ruled out for symptoms. She was instructed to return to her primary care physician. She verbalized understanding.  ?

## 2022-01-29 NOTE — Telephone Encounter (Signed)
Pt is asking for a call from RN to discuss results recently put on My chart ?

## 2022-02-03 DIAGNOSIS — M545 Low back pain, unspecified: Secondary | ICD-10-CM | POA: Diagnosis not present

## 2022-02-03 DIAGNOSIS — M6281 Muscle weakness (generalized): Secondary | ICD-10-CM | POA: Diagnosis not present

## 2022-02-03 DIAGNOSIS — M2569 Stiffness of other specified joint, not elsewhere classified: Secondary | ICD-10-CM | POA: Diagnosis not present

## 2022-02-05 DIAGNOSIS — M6281 Muscle weakness (generalized): Secondary | ICD-10-CM | POA: Diagnosis not present

## 2022-02-05 DIAGNOSIS — M2569 Stiffness of other specified joint, not elsewhere classified: Secondary | ICD-10-CM | POA: Diagnosis not present

## 2022-02-05 DIAGNOSIS — M545 Low back pain, unspecified: Secondary | ICD-10-CM | POA: Diagnosis not present

## 2022-02-10 DIAGNOSIS — M6281 Muscle weakness (generalized): Secondary | ICD-10-CM | POA: Diagnosis not present

## 2022-02-10 DIAGNOSIS — M545 Low back pain, unspecified: Secondary | ICD-10-CM | POA: Diagnosis not present

## 2022-02-10 DIAGNOSIS — M2569 Stiffness of other specified joint, not elsewhere classified: Secondary | ICD-10-CM | POA: Diagnosis not present

## 2022-02-11 DIAGNOSIS — M5451 Vertebrogenic low back pain: Secondary | ICD-10-CM | POA: Diagnosis not present

## 2022-02-12 DIAGNOSIS — M545 Low back pain, unspecified: Secondary | ICD-10-CM | POA: Diagnosis not present

## 2022-02-12 DIAGNOSIS — M2569 Stiffness of other specified joint, not elsewhere classified: Secondary | ICD-10-CM | POA: Diagnosis not present

## 2022-02-12 DIAGNOSIS — M6281 Muscle weakness (generalized): Secondary | ICD-10-CM | POA: Diagnosis not present

## 2022-02-17 DIAGNOSIS — M545 Low back pain, unspecified: Secondary | ICD-10-CM | POA: Diagnosis not present

## 2022-02-17 DIAGNOSIS — M2569 Stiffness of other specified joint, not elsewhere classified: Secondary | ICD-10-CM | POA: Diagnosis not present

## 2022-02-17 DIAGNOSIS — M6281 Muscle weakness (generalized): Secondary | ICD-10-CM | POA: Diagnosis not present

## 2022-02-18 ENCOUNTER — Ambulatory Visit
Admission: RE | Admit: 2022-02-18 | Discharge: 2022-02-18 | Disposition: A | Discharge status: Home / Self Care | Payer: No Typology Code available for payment source | Source: Ambulatory Visit | Attending: Family Medicine | Admitting: Family Medicine

## 2022-02-18 DIAGNOSIS — E7800 Pure hypercholesterolemia, unspecified: Secondary | ICD-10-CM

## 2022-02-24 DIAGNOSIS — M545 Low back pain, unspecified: Secondary | ICD-10-CM | POA: Diagnosis not present

## 2022-02-24 DIAGNOSIS — M6281 Muscle weakness (generalized): Secondary | ICD-10-CM | POA: Diagnosis not present

## 2022-02-24 DIAGNOSIS — M2569 Stiffness of other specified joint, not elsewhere classified: Secondary | ICD-10-CM | POA: Diagnosis not present

## 2022-02-26 DIAGNOSIS — M545 Low back pain, unspecified: Secondary | ICD-10-CM | POA: Diagnosis not present

## 2022-02-26 DIAGNOSIS — M6281 Muscle weakness (generalized): Secondary | ICD-10-CM | POA: Diagnosis not present

## 2022-02-26 DIAGNOSIS — M2569 Stiffness of other specified joint, not elsewhere classified: Secondary | ICD-10-CM | POA: Diagnosis not present

## 2022-03-02 ENCOUNTER — Ambulatory Visit: Payer: PPO | Admitting: Cardiology

## 2022-03-02 ENCOUNTER — Encounter: Payer: Self-pay | Admitting: Cardiology

## 2022-03-02 VITALS — BP 118/68 | HR 83 | Temp 98.0°F | Resp 16 | Ht 65.0 in | Wt 118.0 lb

## 2022-03-02 DIAGNOSIS — I288 Other diseases of pulmonary vessels: Secondary | ICD-10-CM

## 2022-03-02 DIAGNOSIS — Z8774 Personal history of (corrected) congenital malformations of heart and circulatory system: Secondary | ICD-10-CM

## 2022-03-02 DIAGNOSIS — I4510 Unspecified right bundle-branch block: Secondary | ICD-10-CM

## 2022-03-02 DIAGNOSIS — R012 Other cardiac sounds: Secondary | ICD-10-CM

## 2022-03-02 NOTE — Progress Notes (Signed)
? ?Primary Physician/Referring:  [name], MD ? ?Patient ID: Whitney Johnson, female    DOB: Jul 18, 1951, 71 y.o.   MRN: [id number] ? ?Chief Complaint  ?Patient presents with  ? Aortic valve disorder  ? New Patient (Initial Visit)  ? ?HPI:   ? ?Whitney Johnson  is a 71 y.o. fairly active Caucasian female, her husband also sees me, referred to me for evaluation of abnormal imaging, patient underwent coronary calcium score and at that time was found to have markedly enlarged pulmonary artery.  Patient is asymptomatic. ? ?Past Medical History:  ?Diagnosis Date  ? Environmental and seasonal allergies   ? Squamous cell carcinoma of skin 04/19/2018  ? left upper arm - CX3 + 5FU  ? ?Past Surgical History:  ?Procedure Laterality Date  ? APPENDECTOMY  11/30/1956  ? VSD REPAIR    ? At age 71 years at [facility]  ? ?History reviewed. No pertinent family history.  ?Social History  ? ?Tobacco Use  ? Smoking status: Former  ?  Packs/day: 0.50  ?  Years: 20.00  ?  Pack years: 10.00  ?  Types: Cigarettes  ?  Quit date: 76  ?  Years since quitting: 33.2  ? Smokeless tobacco: Never  ?Substance Use Topics  ? Alcohol use: Yes  ?  Comment: occ  ? ?Marital Status: Divorced  ?ROS  ?Review of Systems  ?Cardiovascular:  Negative for chest pain, dyspnea on exertion and leg swelling.  ?Objective  ?Blood pressure 118/68, pulse 83, temperature 98 ?F (36.7 ?C), resp. rate 16, height _0  (1.651 m), weight 118 lb (53.5 kg), SpO2 96 %. Body mass index is 19.64 kg/m?.  ? ?  03/02/2022  ? 10:59 AM 01/26/2022  ?  8:20 AM 09/09/2015  ? 11:48 AM  ?Vitals with BMI  ?Height _1  _2  _3   ?Weight 118 lbs 121 lbs 128 lbs  ?BMI 19.64 20.14 21.3  ?Systolic 185 631 497  ?Diastolic 68 76 68  ?Pulse 83 79 72  ?  ?Physical Exam ?Neck:  ?   Vascular: No JVD.  ?Cardiovascular:  ?   Rate and Rhythm: Normal rate and regular rhythm.  ?   Pulses: Normal pulses and intact distal pulses.  ?   Heart sounds: Murmur heard.  ?Blowing early diastolic murmur is  present with a grade of 3/4 at the upper left sternal border.  ?  No gallop.  ?   Comments: S1 is also split, S2 is widely split with normal respiratory variation.  Loud P2. ?Pulmonary:  ?   Effort: Pulmonary effort is normal.  ?   Breath sounds: Normal breath sounds.  ?Abdominal:  ?   General: Bowel sounds are normal.  ?   Palpations: Abdomen is soft.  ?Musculoskeletal:  ?   Right lower leg: No edema.  ?   Left lower leg: No edema.  ?  ? ?Medications and allergies  ? ?Allergies  ?Allergen Reactions  ? Lexapro [Escitalopram] Other (See Comments)  ?  ? ?Medication list after today's encounter  ? ?Current Outpatient Medications:  ?  budesonide (ENTOCORT EC) 3 MG 24 hr capsule, Take 3 mg by mouth at bedtime., Disp: , Rfl:  ?  Calcium-Vitamin D-Vitamin K (CALCIUM + D) [phone] MG-UNT-MCG CHEW, See admin instructions., Disp: , Rfl:  ?  Coenzyme Q10 (CO Q 10) 10 MG CAPS, See admin instructions., Disp: , Rfl:  ?  fluticasone (VERAMYST) 27.5 MCG/SPRAY nasal spray, Place 2 sprays into the nose  daily., Disp: , Rfl:  ?  meloxicam (MOBIC) 15 MG tablet, Take 15 mg by mouth daily., Disp: , Rfl:  ?  zolpidem (AMBIEN CR) 6.25 MG CR tablet, Take 12.5 mg by mouth at bedtime as needed., Disp: , Rfl:  ? ? ?Laboratory examination:  ? ?External labs:  ? ?Labs 02/18/2022: ? ?Total cholesterol 197, triglycerides 106, HDL 66, LDL 112.  Non-HDL cholesterol 131. ? ?Hb 15.2/HCT 43.5, platelets 261, normal indicis. ? ?Serum glucose 150 mg, BUN 17, creatinine 0.76, EGFR 84 mL, potassium 4.2. ? ?TSH normal. ?ANA negative. ? ?Radiology:  ? ?Chest x-ray 06/21/2015: ?Mediastinum and hilar structures are normal. Lungs are clear. No ?pleural effusion or pneumothorax. Left base pleural parenchymal ?thickening consistent scarring. No acute bony abnormality. ? ?Coronary calcium score 02/18/2022: ?Total agate stent score 0.  Ascending and descending thoracic aortic measurements are normal. ?Significant dilatation of the main pulmonary artery, measuring  4.8 cm on axial images and 5.2 cm on coronal and sagittal reconstruction. ?Full by 5 mm pulmonary nodule in the lateral left upper lobe.  Other ill-defined areas of subpleural nodularity bilaterally appear to be associated with pulmonary scarring. ? ?Cardiac Studies:  ? ?Echocardiogram 12/10/2010: ?Normal LV systolic function, EF 60 to 65%.  Diastolic septal flattening concerning for pulmonary hypertension. ?Mild aortic regurgitation. ?Mild right ventricular dilatation. ?Moderate pulmonary valve regurgitation. ?Consider work-up for pulmonary hypertension. ? ?Echocardiogram 01/01/2011: ?Normal LV size and function.  EF 60 to 65%.  Septal bowing/flattening of the left ventricle suggestive of elevated right ventricular volume. ?Early positive bubble study with large amount of agitated saline crossover from middle of interatrial septum suggestive of atrial septal defect.  No residual VSD identified. ? ?TEE 02/18/2011: ?- Left ventricle: The cavity size was normal. Wall thickness was  ?  normal. Systolic function was normal. The estimated ejection  ?  fraction was in the range of 55% to 60%.  ?- Aortic valve: No evidence of vegetation. Trivial regurgitation.  ?- Mitral valve: No evidence of vegetation.  ?- Left atrium: No evidence of thrombus in the atrial cavity or appendage.  ?- Right atrium: No evidence of thrombus in the atrial cavity or  appendage.  ?- Atrial septum: There was a patent foramen ovale. Positive bubble  ?  contrast study. Small amount of bubble crossover during cough  ?  across PFO. No evidenceof atrial septal defect.  ?- Tricuspid valve: No evidence of vegetation.  ?- Pulmonic valve: No evidence of vegetation.  ?- Superior vena cava: The study excluded a thrombus.  ? ?EKG:  ? ?EKG 03/02/2022: Normal sinus rhythm at rate of 69 bpm, left atrial enlargement, normal axis.  Right bundle branch block.  Low-voltage complexes, consider pulmonary disease pattern.   ? ?Assessment  ? ?  ICD-10-CM   ?1. Idiopathic  dilatation of pulmonary artery (HCC)  I28.8 EKG 12-Lead  ?  PCV ECHOCARDIOGRAM COMPLETE  ?  ?2. Abnormal heart sounds  R01.2   ?  ?3. S/P VSD repair  Z87.74 PCV ECHOCARDIOGRAM COMPLETE  ?  ?4. RBBB  I45.10   ?  ?  ?There are no discontinued medications.  ?No orders of the defined types were placed in this encounter. ? ?Orders Placed This Encounter  ?Procedures  ? EKG 12-Lead  ? PCV ECHOCARDIOGRAM COMPLETE  ?  Standing Status:   Future  ?  Standing Expiration Date:   03/03/2023  ? ?Recommendations:  ? ?FLYNN LININGER is a 71 y.o. fairly active Caucasian female, her husband also sees me, referred  to me for evaluation of abnormal imaging, patient underwent coronary calcium score and at that time was found to have markedly enlarged pulmonary artery.  Patient is asymptomatic. ? ?She has abnormal physical exam with prominent P2, widely split but normal respiratory variation of second heart sound related to underlying right bundle branch block, and a early diastolic murmur of pulmonary regurgitation.  Patient has history of VSD repair at age 23 years.  She has not had any further complications from the same.  She has had a TEE in 2012 which was essentially unremarkable with a small PFO, however transthoracic echocardiogram performed prior to the TEE is very concerning for right ventricular volume and pressure overload pattern with flattened septum.  There is also strongly positive right to left shunting across the interatrial septum.  Suspect she will need right heart catheterization eventually. ? ?I will schedule for a transthoracic echocardiogram and see her back at that time.  Reviewed her external labs, ANA is negative.  There is no clinical evidence of right-sided heart failure.  Otherwise no history of hypertension or hyperlipidemia or diabetes mellitus. ? ? ? ?Adrian Prows, MD, [facility] ?03/02/2022, 12:49 PM ?Office: [phone] ?

## 2022-03-03 DIAGNOSIS — M6281 Muscle weakness (generalized): Secondary | ICD-10-CM | POA: Diagnosis not present

## 2022-03-03 DIAGNOSIS — M2569 Stiffness of other specified joint, not elsewhere classified: Secondary | ICD-10-CM | POA: Diagnosis not present

## 2022-03-03 DIAGNOSIS — M545 Low back pain, unspecified: Secondary | ICD-10-CM | POA: Diagnosis not present

## 2022-03-05 DIAGNOSIS — M2569 Stiffness of other specified joint, not elsewhere classified: Secondary | ICD-10-CM | POA: Diagnosis not present

## 2022-03-05 DIAGNOSIS — M6281 Muscle weakness (generalized): Secondary | ICD-10-CM | POA: Diagnosis not present

## 2022-03-05 DIAGNOSIS — M545 Low back pain, unspecified: Secondary | ICD-10-CM | POA: Diagnosis not present

## 2022-03-10 DIAGNOSIS — M545 Low back pain, unspecified: Secondary | ICD-10-CM | POA: Diagnosis not present

## 2022-03-10 DIAGNOSIS — M6281 Muscle weakness (generalized): Secondary | ICD-10-CM | POA: Diagnosis not present

## 2022-03-10 DIAGNOSIS — M2569 Stiffness of other specified joint, not elsewhere classified: Secondary | ICD-10-CM | POA: Diagnosis not present

## 2022-03-12 ENCOUNTER — Ambulatory Visit: Payer: PPO

## 2022-03-12 DIAGNOSIS — I288 Other diseases of pulmonary vessels: Secondary | ICD-10-CM | POA: Diagnosis not present

## 2022-03-12 DIAGNOSIS — Z8774 Personal history of (corrected) congenital malformations of heart and circulatory system: Secondary | ICD-10-CM

## 2022-03-12 DIAGNOSIS — R012 Other cardiac sounds: Secondary | ICD-10-CM | POA: Diagnosis not present

## 2022-03-31 ENCOUNTER — Encounter: Payer: Self-pay | Admitting: Dermatology

## 2022-03-31 ENCOUNTER — Ambulatory Visit: Payer: PPO | Admitting: Dermatology

## 2022-03-31 DIAGNOSIS — D485 Neoplasm of uncertain behavior of skin: Secondary | ICD-10-CM

## 2022-03-31 DIAGNOSIS — Z85828 Personal history of other malignant neoplasm of skin: Secondary | ICD-10-CM

## 2022-03-31 DIAGNOSIS — L820 Inflamed seborrheic keratosis: Secondary | ICD-10-CM | POA: Diagnosis not present

## 2022-03-31 DIAGNOSIS — Z1283 Encounter for screening for malignant neoplasm of skin: Secondary | ICD-10-CM

## 2022-03-31 DIAGNOSIS — Z8589 Personal history of malignant neoplasm of other organs and systems: Secondary | ICD-10-CM

## 2022-03-31 NOTE — Patient Instructions (Signed)

## 2022-04-18 ENCOUNTER — Encounter: Payer: Self-pay | Admitting: Dermatology

## 2022-04-18 NOTE — Progress Notes (Signed)
   Follow-Up Visit   Subjective  Whitney Johnson is a 71 y.o. female who presents for the following: Annual Exam (Yearly full body patient wants more isk removed, personal history of scc 2019 left upper arm).  General skin check, changing spots on side and behind ear.  History of nonmelanoma skin cancer. Location:  Duration:  Quality:  Associated Signs/Symptoms: Modifying Factors:  Severity:  Timing: Context:   Objective  Well appearing patient in no apparent distress; mood and affect are within normal limits. General skin examination: No atypical nevi or signs of NMSC noted at the time of the visit.   Left Upper Arm - Anterior Personal h/o 2019 scc left upper arm   Right Postauricular Area 1 cm inflamed tan crust     Left Flank 2 cm irregularly shaped slightly inflamed brown crust       A full examination was performed including scalp, head, eyes, ears, nose, lips, neck, chest, axillae, abdomen, back, buttocks, bilateral upper extremities, bilateral lower extremities, hands, feet, fingers, toes, fingernails, and toenails. All findings within normal limits unless otherwise noted below.  Areas beneath undergarments not fully examined.   Assessment & Plan    Screening exam for skin cancer  Yearly skin checks   History of squamous cell carcinoma Left Upper Arm - Anterior  Scar clear today, recheck as needed change  Neoplasm of uncertain behavior of skin (2) Right Postauricular Area  Skin / nail biopsy Type of biopsy: tangential   Informed consent: discussed and consent obtained   Timeout: patient name, date of birth, surgical site, and procedure verified   Anesthesia: the lesion was anesthetized in a standard fashion   Anesthetic:  1% lidocaine w/ epinephrine 1-100,000 local infiltration Instrument used: flexible razor blade   Hemostasis achieved with: aluminum chloride and electrodesiccation   Outcome: patient tolerated procedure well   Post-procedure  details: wound care instructions given    Specimen 1 - Surgical pathology Differential Diagnosis: isk cautery only  Check Margins: No  Left Flank  Skin / nail biopsy Type of biopsy: tangential   Informed consent: discussed and consent obtained   Timeout: patient name, date of birth, surgical site, and procedure verified   Anesthesia: the lesion was anesthetized in a standard fashion   Anesthetic:  1% lidocaine w/ epinephrine 1-100,000 local infiltration Instrument used: flexible razor blade   Hemostasis achieved with: aluminum chloride and electrodesiccation   Outcome: patient tolerated procedure well   Post-procedure details: wound care instructions given    Specimen 2 - Surgical pathology Differential Diagnosis: isk cautery only  Check Margins: No      I, Lavonna Monarch, MD, have reviewed all documentation for this visit.  The documentation on 04/18/22 for the exam, diagnosis, procedures, and orders are all accurate and complete.

## 2022-04-20 ENCOUNTER — Ambulatory Visit: Payer: PPO | Admitting: Cardiology

## 2022-06-15 DIAGNOSIS — K52831 Collagenous colitis: Secondary | ICD-10-CM | POA: Diagnosis not present

## 2022-08-13 DIAGNOSIS — Z20822 Contact with and (suspected) exposure to covid-19: Secondary | ICD-10-CM | POA: Diagnosis not present

## 2022-08-20 DIAGNOSIS — G4700 Insomnia, unspecified: Secondary | ICD-10-CM | POA: Diagnosis not present

## 2022-12-01 IMAGING — CT CT CARDIAC CORONARY ARTERY CALCIUM SCORE
3 series · 12 of 20 positions shown, 14 images · non-contrast
Comparison: None.

CLINICAL DATA: 70-year-old Caucasian female with history of
hyperlipidemia and prior smoking history.

EXAM:
CT CARDIAC CORONARY ARTERY CALCIUM SCORE
TECHNIQUE: Non-contrast imaging through the heart was performed using
prospective ECG gating. Image post processing was performed on an
independent workstation, allowing for quantitative analysis of the
heart and coronary arteries. Note that this exam targets the heart
and the chest was not imaged in its entirety.
RADIATION DOSE REDUCTION: This exam was performed according to the
departmental dose-optimization program which includes automated
exposure control, adjustment of the mA and/or kV according to
patient size and/or use of iterative reconstruction technique.

[Series 2: calcium scoring 2.00 qr36 bestdiast 66% hrt calciu · axial · 0.37mm/px · z∈[+1487,+1523]mm · 2 of 90 slices shown]
[im 18/90  vessel]
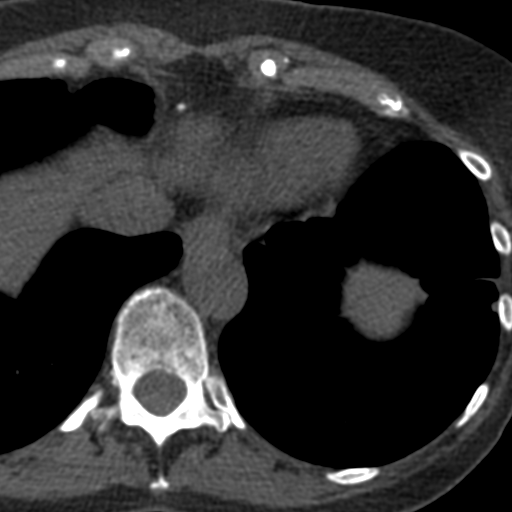
[im 36/90  vessel]
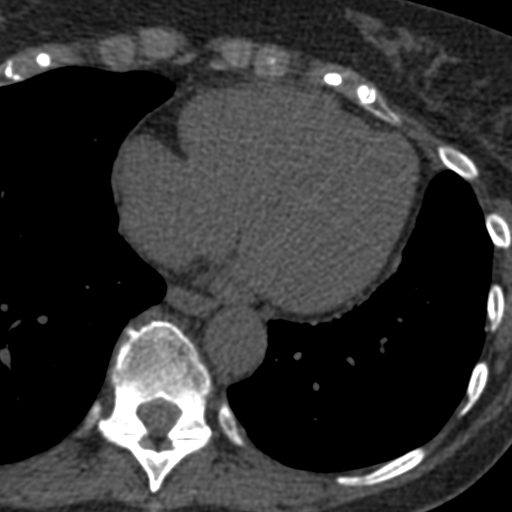

[Series 3: calcium scoring 2.00 br40 bestdiast 66% axial · axial · 0.49mm/px · z∈[+1481,+1601]mm · 5 of 90 slices shown, 7 images]
[im 15/90  vessel]
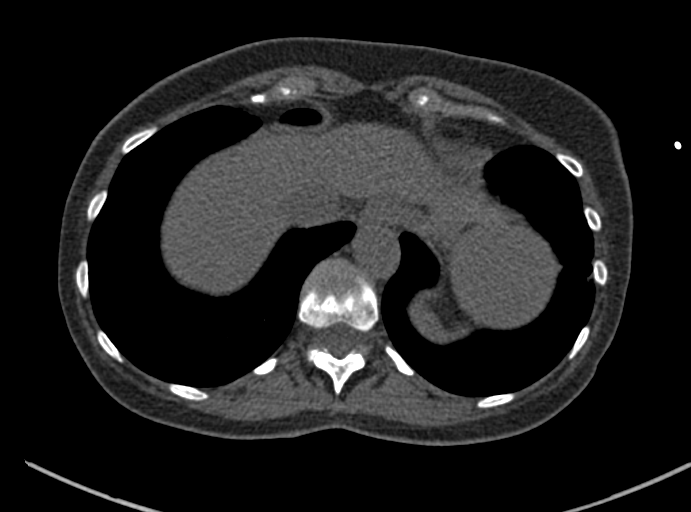
[im 15/90  lung]
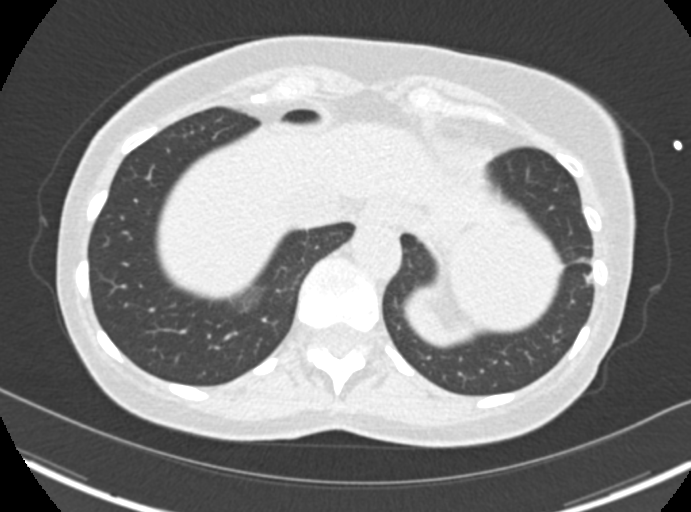
[im 30/90  vessel]
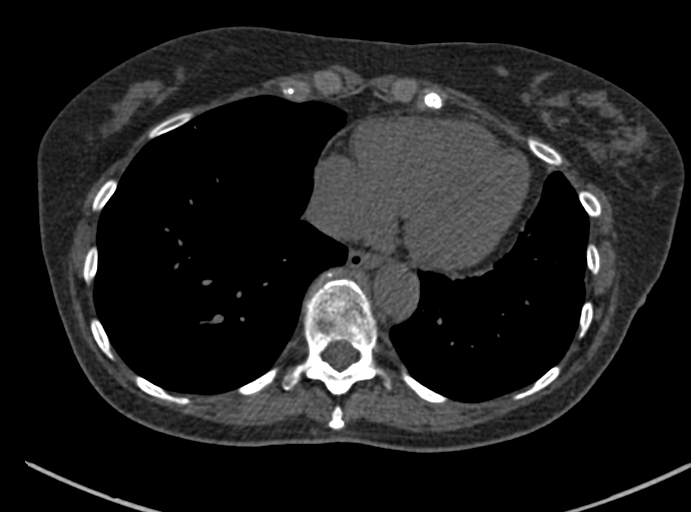
[im 45/90  vessel]
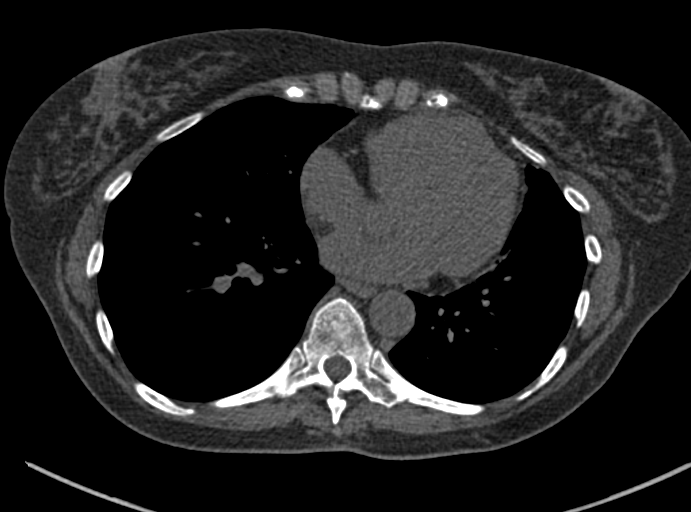
[im 60/90  vessel]
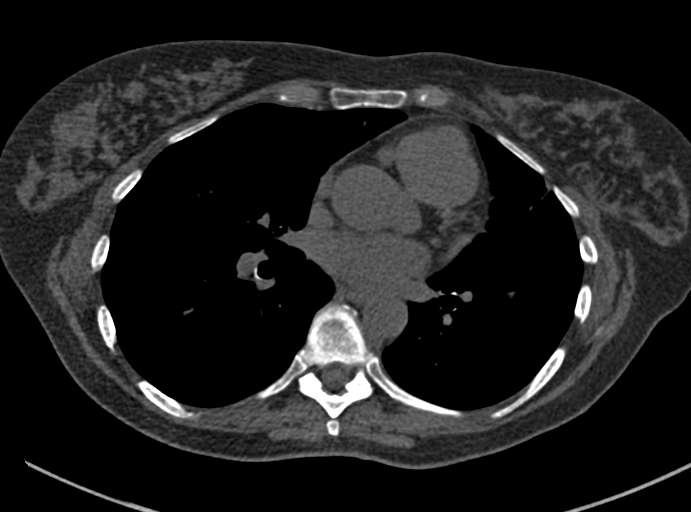
[im 75/90  vessel]
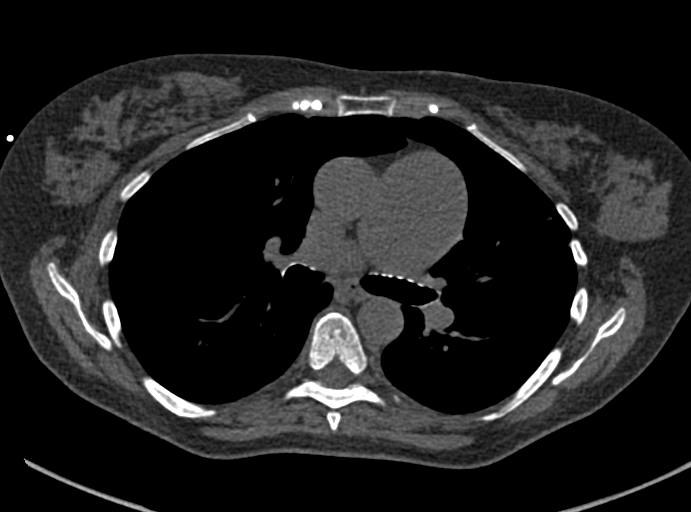
[im 75/90  lung]
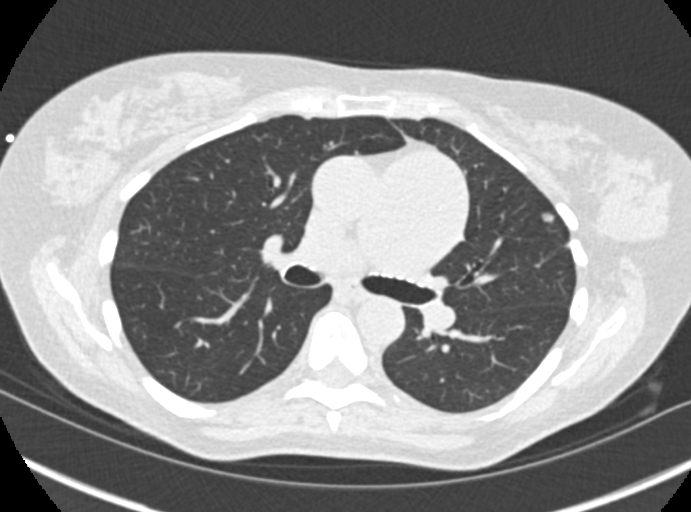

[Series 9: calcium scoring 2.00 br60 bestdiast 66% lungs · axial · 0.49mm/px · z∈[+1481,+1601]mm · 5 of 90 slices shown]
[im 15/90  vessel]
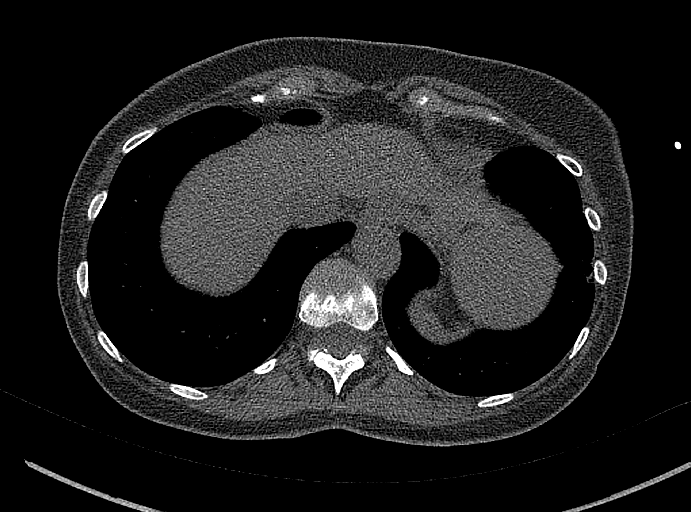
[im 30/90  vessel]
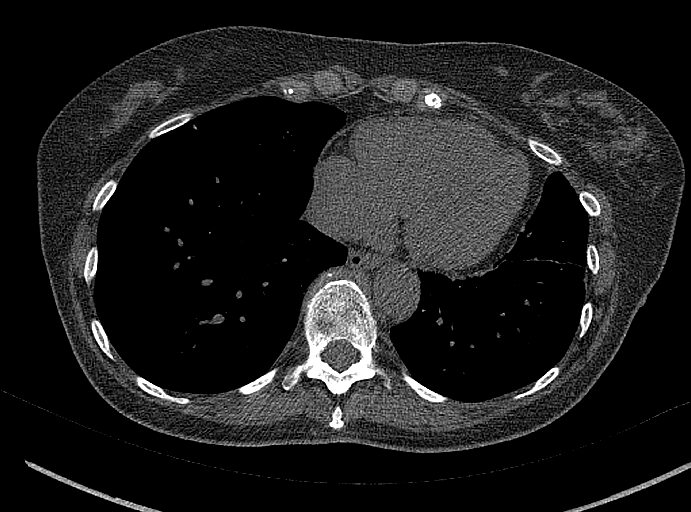
[im 45/90  vessel]
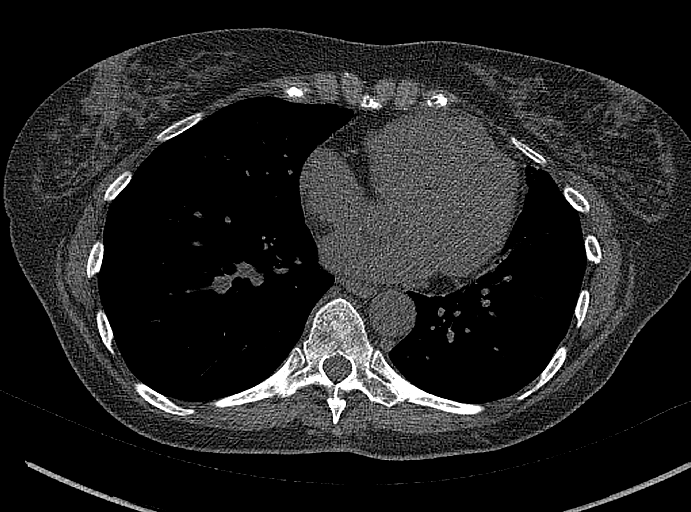
[im 60/90  vessel]
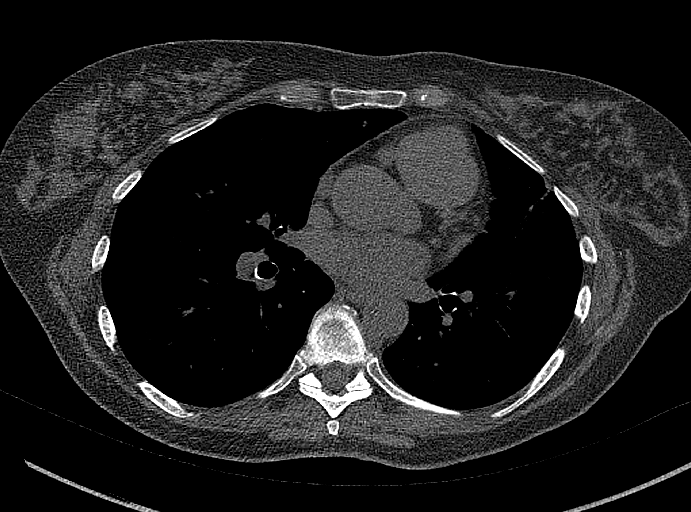
[im 75/90  vessel]
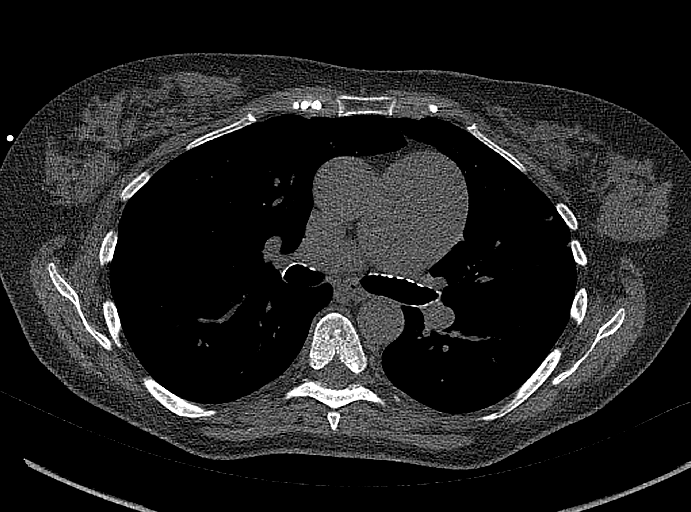

[12 of 20 positions shown; findings below may reference images not displayed]

FINDINGS: CORONARY CALCIUM SCORES:

Left Main: 0

LAD: 0

LCx: 0

RCA: 0

Total Agatston Score: 0

[HOSPITAL] percentile: 0

AORTA MEASUREMENTS:

Ascending Aorta: 32 mm

Descending Aorta: 22 mm

OTHER FINDINGS:

The heart size is within normal limits. No pericardial fluid is
identified. Visualized segments of the thoracic aorta is normal in
caliber. There is significant dilatation of the main pulmonary
artery segment with the main pulmonary artery measuring up to 4.8 cm
on axial images and 5.2 cm on coronal and sagittal reconstructions.
Some dilatation extends into the right pulmonary artery. The left
visualized pulmonary artery is not significantly dilated. Visualized
mediastinum and hilar regions demonstrate no lymphadenopathy or
masses. 4 x 5 mm pulmonary nodule in the lateral left upper lobe on
image 17 and 4 x 6 mm nodule in the anterior left lower lobe on
image 65. Other ill-defined areas of subpleural nodularity
bilaterally appear to be associated with pulmonary scarring. There
likely is some degree of underlying emphysematous lung disease.
Visualized lungs show no evidence of pulmonary edema, consolidation,
pneumothorax or pleural fluid. Visualized upper abdomen and bony
structures are unremarkable.
IMPRESSION: 1. Coronary calcium score 0.
2. Severe dilatation of the main pulmonary artery measuring up to
5.2 cm in maximum diameter. Recommend correlation with
echocardiography and referral to thoracic surgery.
3. 5 mm and 6 mm left upper lobe and left lower lobe pulmonary
nodules.

Non-contrast chest CT at 3-6 months is recommended. If the nodules
are stable at time of repeat CT, then future CT at 18-24 months
(from today's scan) is considered optional for low-risk patients,
but is recommended for high-risk patients. This recommendation
follows the consensus statement: Guidelines for Management of
Incidental Pulmonary Nodules Detected on CT Images: From the

## 2022-12-22 DIAGNOSIS — M5416 Radiculopathy, lumbar region: Secondary | ICD-10-CM | POA: Diagnosis not present

## 2022-12-24 ENCOUNTER — Other Ambulatory Visit: Payer: Self-pay | Admitting: Physical Medicine and Rehabilitation

## 2022-12-24 DIAGNOSIS — M5416 Radiculopathy, lumbar region: Secondary | ICD-10-CM

## 2023-01-13 DIAGNOSIS — M25551 Pain in right hip: Secondary | ICD-10-CM | POA: Diagnosis not present

## 2023-01-19 DIAGNOSIS — H52203 Unspecified astigmatism, bilateral: Secondary | ICD-10-CM | POA: Diagnosis not present

## 2023-01-19 DIAGNOSIS — Z961 Presence of intraocular lens: Secondary | ICD-10-CM | POA: Diagnosis not present

## 2023-01-20 DIAGNOSIS — Z681 Body mass index (BMI) 19 or less, adult: Secondary | ICD-10-CM | POA: Diagnosis not present

## 2023-01-20 DIAGNOSIS — Z1389 Encounter for screening for other disorder: Secondary | ICD-10-CM | POA: Diagnosis not present

## 2023-01-20 DIAGNOSIS — Z0000 Encounter for general adult medical examination without abnormal findings: Secondary | ICD-10-CM | POA: Diagnosis not present

## 2023-01-22 ENCOUNTER — Ambulatory Visit
Admission: RE | Admit: 2023-01-22 | Discharge: 2023-01-22 | Disposition: A | Discharge status: Home / Self Care | Payer: PPO | Source: Ambulatory Visit | Attending: Physical Medicine and Rehabilitation | Admitting: Physical Medicine and Rehabilitation

## 2023-01-22 DIAGNOSIS — M545 Low back pain, unspecified: Secondary | ICD-10-CM | POA: Diagnosis not present

## 2023-01-22 DIAGNOSIS — M5416 Radiculopathy, lumbar region: Secondary | ICD-10-CM

## 2023-01-26 DIAGNOSIS — Z681 Body mass index (BMI) 19 or less, adult: Secondary | ICD-10-CM | POA: Diagnosis not present

## 2023-01-26 DIAGNOSIS — Z720 Tobacco use: Secondary | ICD-10-CM | POA: Diagnosis not present

## 2023-01-26 DIAGNOSIS — R918 Other nonspecific abnormal finding of lung field: Secondary | ICD-10-CM | POA: Diagnosis not present

## 2023-02-01 DIAGNOSIS — M533 Sacrococcygeal disorders, not elsewhere classified: Secondary | ICD-10-CM | POA: Diagnosis not present

## 2023-02-04 DIAGNOSIS — M533 Sacrococcygeal disorders, not elsewhere classified: Secondary | ICD-10-CM | POA: Diagnosis not present

## 2023-02-15 DIAGNOSIS — G4700 Insomnia, unspecified: Secondary | ICD-10-CM | POA: Diagnosis not present

## 2023-02-22 DIAGNOSIS — M47816 Spondylosis without myelopathy or radiculopathy, lumbar region: Secondary | ICD-10-CM | POA: Diagnosis not present

## 2023-03-02 DIAGNOSIS — E559 Vitamin D deficiency, unspecified: Secondary | ICD-10-CM | POA: Diagnosis not present

## 2023-03-02 DIAGNOSIS — E538 Deficiency of other specified B group vitamins: Secondary | ICD-10-CM | POA: Diagnosis not present

## 2023-03-02 DIAGNOSIS — G4700 Insomnia, unspecified: Secondary | ICD-10-CM | POA: Diagnosis not present

## 2023-03-03 ENCOUNTER — Other Ambulatory Visit (HOSPITAL_COMMUNITY): Payer: Self-pay | Admitting: Pain Medicine

## 2023-03-03 DIAGNOSIS — Z681 Body mass index (BMI) 19 or less, adult: Secondary | ICD-10-CM | POA: Diagnosis not present

## 2023-03-03 DIAGNOSIS — Z23 Encounter for immunization: Secondary | ICD-10-CM | POA: Diagnosis not present

## 2023-03-03 DIAGNOSIS — Z1239 Encounter for other screening for malignant neoplasm of breast: Secondary | ICD-10-CM | POA: Diagnosis not present

## 2023-03-03 DIAGNOSIS — Z780 Asymptomatic menopausal state: Secondary | ICD-10-CM | POA: Diagnosis not present

## 2023-03-03 DIAGNOSIS — R918 Other nonspecific abnormal finding of lung field: Secondary | ICD-10-CM | POA: Diagnosis not present

## 2023-03-03 DIAGNOSIS — G4700 Insomnia, unspecified: Secondary | ICD-10-CM | POA: Diagnosis not present

## 2023-03-03 DIAGNOSIS — E781 Pure hyperglyceridemia: Secondary | ICD-10-CM | POA: Diagnosis not present

## 2023-03-03 DIAGNOSIS — E559 Vitamin D deficiency, unspecified: Secondary | ICD-10-CM | POA: Diagnosis not present

## 2023-03-23 ENCOUNTER — Ambulatory Visit (HOSPITAL_COMMUNITY)
Admission: RE | Admit: 2023-03-23 | Discharge: 2023-03-23 | Disposition: A | Discharge status: Home / Self Care | Payer: PPO | Source: Ambulatory Visit | Attending: Pain Medicine | Admitting: Pain Medicine

## 2023-03-23 DIAGNOSIS — R918 Other nonspecific abnormal finding of lung field: Secondary | ICD-10-CM

## 2023-04-06 ENCOUNTER — Ambulatory Visit: Payer: PPO | Admitting: Dermatology

## 2023-05-19 DIAGNOSIS — D485 Neoplasm of uncertain behavior of skin: Secondary | ICD-10-CM | POA: Diagnosis not present

## 2023-05-19 DIAGNOSIS — L821 Other seborrheic keratosis: Secondary | ICD-10-CM | POA: Diagnosis not present

## 2023-05-19 DIAGNOSIS — C44529 Squamous cell carcinoma of skin of other part of trunk: Secondary | ICD-10-CM | POA: Diagnosis not present

## 2023-06-24 DIAGNOSIS — C44529 Squamous cell carcinoma of skin of other part of trunk: Secondary | ICD-10-CM | POA: Diagnosis not present

## 2023-06-24 DIAGNOSIS — L820 Inflamed seborrheic keratosis: Secondary | ICD-10-CM | POA: Diagnosis not present

## 2023-06-28 DIAGNOSIS — Z1231 Encounter for screening mammogram for malignant neoplasm of breast: Secondary | ICD-10-CM | POA: Diagnosis not present

## 2023-06-28 DIAGNOSIS — Z681 Body mass index (BMI) 19 or less, adult: Secondary | ICD-10-CM | POA: Diagnosis not present

## 2023-06-28 DIAGNOSIS — Z124 Encounter for screening for malignant neoplasm of cervix: Secondary | ICD-10-CM | POA: Diagnosis not present

## 2023-06-28 DIAGNOSIS — Z779 Other contact with and (suspected) exposures hazardous to health: Secondary | ICD-10-CM | POA: Diagnosis not present

## 2023-06-28 DIAGNOSIS — Z1151 Encounter for screening for human papillomavirus (HPV): Secondary | ICD-10-CM | POA: Diagnosis not present

## 2023-08-19 DIAGNOSIS — N879 Dysplasia of cervix uteri, unspecified: Secondary | ICD-10-CM | POA: Diagnosis not present

## 2023-08-19 DIAGNOSIS — N870 Mild cervical dysplasia: Secondary | ICD-10-CM | POA: Diagnosis not present

## 2023-08-23 DIAGNOSIS — G4700 Insomnia, unspecified: Secondary | ICD-10-CM | POA: Diagnosis not present

## 2023-09-01 DIAGNOSIS — N879 Dysplasia of cervix uteri, unspecified: Secondary | ICD-10-CM | POA: Diagnosis not present

## 2023-09-15 DIAGNOSIS — N879 Dysplasia of cervix uteri, unspecified: Secondary | ICD-10-CM | POA: Diagnosis not present

## 2023-10-18 DIAGNOSIS — H6120 Impacted cerumen, unspecified ear: Secondary | ICD-10-CM | POA: Diagnosis not present

## 2023-10-18 DIAGNOSIS — H9319 Tinnitus, unspecified ear: Secondary | ICD-10-CM | POA: Diagnosis not present

## 2023-10-18 DIAGNOSIS — Z681 Body mass index (BMI) 19 or less, adult: Secondary | ICD-10-CM | POA: Diagnosis not present

## 2023-10-26 DIAGNOSIS — K52832 Lymphocytic colitis: Secondary | ICD-10-CM | POA: Diagnosis not present

## 2023-11-08 DIAGNOSIS — I700 Atherosclerosis of aorta: Secondary | ICD-10-CM | POA: Diagnosis not present

## 2023-11-08 DIAGNOSIS — Z681 Body mass index (BMI) 19 or less, adult: Secondary | ICD-10-CM | POA: Diagnosis not present

## 2023-11-08 DIAGNOSIS — K52832 Lymphocytic colitis: Secondary | ICD-10-CM | POA: Diagnosis not present

## 2023-11-08 DIAGNOSIS — Q2579 Other congenital malformations of pulmonary artery: Secondary | ICD-10-CM | POA: Diagnosis not present

## 2023-11-08 DIAGNOSIS — G4700 Insomnia, unspecified: Secondary | ICD-10-CM | POA: Diagnosis not present

## 2023-11-08 DIAGNOSIS — R918 Other nonspecific abnormal finding of lung field: Secondary | ICD-10-CM | POA: Diagnosis not present

## 2024-02-08 DIAGNOSIS — M6281 Muscle weakness (generalized): Secondary | ICD-10-CM | POA: Diagnosis not present

## 2024-02-08 DIAGNOSIS — M7541 Impingement syndrome of right shoulder: Secondary | ICD-10-CM | POA: Diagnosis not present

## 2024-02-10 DIAGNOSIS — M7541 Impingement syndrome of right shoulder: Secondary | ICD-10-CM | POA: Diagnosis not present

## 2024-02-10 DIAGNOSIS — M6281 Muscle weakness (generalized): Secondary | ICD-10-CM | POA: Diagnosis not present

## 2024-02-15 DIAGNOSIS — M6281 Muscle weakness (generalized): Secondary | ICD-10-CM | POA: Diagnosis not present

## 2024-02-15 DIAGNOSIS — M7541 Impingement syndrome of right shoulder: Secondary | ICD-10-CM | POA: Diagnosis not present

## 2024-02-22 DIAGNOSIS — M6281 Muscle weakness (generalized): Secondary | ICD-10-CM | POA: Diagnosis not present

## 2024-02-22 DIAGNOSIS — M7541 Impingement syndrome of right shoulder: Secondary | ICD-10-CM | POA: Diagnosis not present

## 2024-02-24 DIAGNOSIS — M7541 Impingement syndrome of right shoulder: Secondary | ICD-10-CM | POA: Diagnosis not present

## 2024-02-24 DIAGNOSIS — M6281 Muscle weakness (generalized): Secondary | ICD-10-CM | POA: Diagnosis not present

## 2024-02-25 DIAGNOSIS — R634 Abnormal weight loss: Secondary | ICD-10-CM | POA: Diagnosis not present

## 2024-03-01 DIAGNOSIS — M25511 Pain in right shoulder: Secondary | ICD-10-CM | POA: Diagnosis not present

## 2024-03-02 DIAGNOSIS — D7589 Other specified diseases of blood and blood-forming organs: Secondary | ICD-10-CM | POA: Diagnosis not present

## 2024-03-02 DIAGNOSIS — R634 Abnormal weight loss: Secondary | ICD-10-CM | POA: Diagnosis not present

## 2024-03-02 DIAGNOSIS — Z681 Body mass index (BMI) 19 or less, adult: Secondary | ICD-10-CM | POA: Diagnosis not present

## 2024-03-06 ENCOUNTER — Other Ambulatory Visit: Payer: Self-pay | Admitting: Family Medicine

## 2024-03-06 DIAGNOSIS — R918 Other nonspecific abnormal finding of lung field: Secondary | ICD-10-CM

## 2024-03-13 DIAGNOSIS — R682 Dry mouth, unspecified: Secondary | ICD-10-CM | POA: Diagnosis not present

## 2024-03-13 DIAGNOSIS — Z681 Body mass index (BMI) 19 or less, adult: Secondary | ICD-10-CM | POA: Diagnosis not present

## 2024-03-13 DIAGNOSIS — F419 Anxiety disorder, unspecified: Secondary | ICD-10-CM | POA: Diagnosis not present

## 2024-03-13 DIAGNOSIS — R1310 Dysphagia, unspecified: Secondary | ICD-10-CM | POA: Diagnosis not present

## 2024-03-14 DIAGNOSIS — R630 Anorexia: Secondary | ICD-10-CM | POA: Diagnosis not present

## 2024-03-14 DIAGNOSIS — F419 Anxiety disorder, unspecified: Secondary | ICD-10-CM | POA: Diagnosis not present

## 2024-03-20 DIAGNOSIS — R87610 Atypical squamous cells of undetermined significance on cytologic smear of cervix (ASC-US): Secondary | ICD-10-CM | POA: Diagnosis not present

## 2024-03-20 DIAGNOSIS — R87810 Cervical high risk human papillomavirus (HPV) DNA test positive: Secondary | ICD-10-CM | POA: Diagnosis not present

## 2024-03-20 DIAGNOSIS — R87612 Low grade squamous intraepithelial lesion on cytologic smear of cervix (LGSIL): Secondary | ICD-10-CM | POA: Diagnosis not present

## 2024-03-24 ENCOUNTER — Other Ambulatory Visit: Payer: Self-pay | Admitting: Gastroenterology

## 2024-03-24 DIAGNOSIS — R630 Anorexia: Secondary | ICD-10-CM

## 2024-03-24 DIAGNOSIS — R634 Abnormal weight loss: Secondary | ICD-10-CM

## 2024-03-27 ENCOUNTER — Ambulatory Visit
Admission: RE | Admit: 2024-03-27 | Discharge: 2024-03-27 | Disposition: A | Discharge status: Home / Self Care | Source: Ambulatory Visit | Attending: Gastroenterology | Admitting: Gastroenterology

## 2024-03-27 DIAGNOSIS — R630 Anorexia: Secondary | ICD-10-CM

## 2024-03-27 DIAGNOSIS — R634 Abnormal weight loss: Secondary | ICD-10-CM | POA: Diagnosis not present

## 2024-03-27 MED ORDER — IOPAMIDOL (ISOVUE-300) INJECTION 61%
100.0000 mL | Freq: Once | INTRAVENOUS | Status: AC | PRN
  Administered 2024-03-27: 100 mL via INTRAVENOUS

## 2024-04-04 ENCOUNTER — Ambulatory Visit
Admission: RE | Admit: 2024-04-04 | Discharge: 2024-04-04 | Disposition: A | Discharge status: Home / Self Care | Source: Ambulatory Visit | Attending: Family Medicine | Admitting: Family Medicine

## 2024-04-04 DIAGNOSIS — R918 Other nonspecific abnormal finding of lung field: Secondary | ICD-10-CM

## 2024-04-06 DIAGNOSIS — D6489 Other specified anemias: Secondary | ICD-10-CM | POA: Diagnosis not present

## 2024-04-06 DIAGNOSIS — E559 Vitamin D deficiency, unspecified: Secondary | ICD-10-CM | POA: Diagnosis not present

## 2024-04-06 DIAGNOSIS — K529 Noninfective gastroenteritis and colitis, unspecified: Secondary | ICD-10-CM | POA: Diagnosis not present

## 2024-04-06 DIAGNOSIS — F418 Other specified anxiety disorders: Secondary | ICD-10-CM | POA: Diagnosis not present

## 2024-04-06 DIAGNOSIS — R5383 Other fatigue: Secondary | ICD-10-CM | POA: Diagnosis not present

## 2024-04-06 DIAGNOSIS — E063 Autoimmune thyroiditis: Secondary | ICD-10-CM | POA: Diagnosis not present

## 2024-04-06 DIAGNOSIS — Z7689 Persons encountering health services in other specified circumstances: Secondary | ICD-10-CM | POA: Diagnosis not present

## 2024-04-06 DIAGNOSIS — Z79890 Hormone replacement therapy (postmenopausal): Secondary | ICD-10-CM | POA: Diagnosis not present

## 2024-04-06 DIAGNOSIS — R946 Abnormal results of thyroid function studies: Secondary | ICD-10-CM | POA: Diagnosis not present

## 2024-04-06 DIAGNOSIS — R7301 Impaired fasting glucose: Secondary | ICD-10-CM | POA: Diagnosis not present

## 2024-04-06 DIAGNOSIS — R634 Abnormal weight loss: Secondary | ICD-10-CM | POA: Diagnosis not present

## 2024-04-10 DIAGNOSIS — R5383 Other fatigue: Secondary | ICD-10-CM | POA: Diagnosis not present

## 2024-04-10 DIAGNOSIS — K529 Noninfective gastroenteritis and colitis, unspecified: Secondary | ICD-10-CM | POA: Diagnosis not present

## 2024-04-10 DIAGNOSIS — R946 Abnormal results of thyroid function studies: Secondary | ICD-10-CM | POA: Diagnosis not present

## 2024-04-10 DIAGNOSIS — F418 Other specified anxiety disorders: Secondary | ICD-10-CM | POA: Diagnosis not present

## 2024-04-10 DIAGNOSIS — Z79890 Hormone replacement therapy (postmenopausal): Secondary | ICD-10-CM | POA: Diagnosis not present

## 2024-04-10 DIAGNOSIS — Z7689 Persons encountering health services in other specified circumstances: Secondary | ICD-10-CM | POA: Diagnosis not present

## 2024-04-10 DIAGNOSIS — D6489 Other specified anemias: Secondary | ICD-10-CM | POA: Diagnosis not present

## 2024-04-10 DIAGNOSIS — R7301 Impaired fasting glucose: Secondary | ICD-10-CM | POA: Diagnosis not present

## 2024-04-10 DIAGNOSIS — E063 Autoimmune thyroiditis: Secondary | ICD-10-CM | POA: Diagnosis not present

## 2024-04-10 DIAGNOSIS — R634 Abnormal weight loss: Secondary | ICD-10-CM | POA: Diagnosis not present

## 2024-04-10 DIAGNOSIS — E559 Vitamin D deficiency, unspecified: Secondary | ICD-10-CM | POA: Diagnosis not present

## 2024-04-13 DIAGNOSIS — Z681 Body mass index (BMI) 19 or less, adult: Secondary | ICD-10-CM | POA: Diagnosis not present

## 2024-04-13 DIAGNOSIS — R634 Abnormal weight loss: Secondary | ICD-10-CM | POA: Diagnosis not present

## 2024-04-13 DIAGNOSIS — R630 Anorexia: Secondary | ICD-10-CM | POA: Diagnosis not present

## 2024-05-12 DIAGNOSIS — M25512 Pain in left shoulder: Secondary | ICD-10-CM | POA: Diagnosis not present

## 2024-05-12 DIAGNOSIS — M25511 Pain in right shoulder: Secondary | ICD-10-CM | POA: Diagnosis not present

## 2024-05-12 DIAGNOSIS — M1611 Unilateral primary osteoarthritis, right hip: Secondary | ICD-10-CM | POA: Diagnosis not present

## 2024-05-12 DIAGNOSIS — M25551 Pain in right hip: Secondary | ICD-10-CM | POA: Diagnosis not present

## 2024-05-25 DIAGNOSIS — Z681 Body mass index (BMI) 19 or less, adult: Secondary | ICD-10-CM | POA: Diagnosis not present

## 2024-05-25 DIAGNOSIS — F419 Anxiety disorder, unspecified: Secondary | ICD-10-CM | POA: Diagnosis not present

## 2024-05-25 DIAGNOSIS — H9319 Tinnitus, unspecified ear: Secondary | ICD-10-CM | POA: Diagnosis not present

## 2024-05-25 DIAGNOSIS — R630 Anorexia: Secondary | ICD-10-CM | POA: Diagnosis not present

## 2024-05-25 DIAGNOSIS — G4700 Insomnia, unspecified: Secondary | ICD-10-CM | POA: Diagnosis not present

## 2024-06-01 DIAGNOSIS — M47816 Spondylosis without myelopathy or radiculopathy, lumbar region: Secondary | ICD-10-CM | POA: Diagnosis not present

## 2024-06-14 DIAGNOSIS — M47816 Spondylosis without myelopathy or radiculopathy, lumbar region: Secondary | ICD-10-CM | POA: Diagnosis not present

## 2024-07-03 DIAGNOSIS — M47816 Spondylosis without myelopathy or radiculopathy, lumbar region: Secondary | ICD-10-CM | POA: Diagnosis not present

## 2024-07-24 DIAGNOSIS — R197 Diarrhea, unspecified: Secondary | ICD-10-CM | POA: Diagnosis not present

## 2024-08-15 DIAGNOSIS — K5289 Other specified noninfective gastroenteritis and colitis: Secondary | ICD-10-CM | POA: Diagnosis not present

## 2024-09-08 DIAGNOSIS — M25511 Pain in right shoulder: Secondary | ICD-10-CM | POA: Diagnosis not present

## 2024-09-27 DIAGNOSIS — K5289 Other specified noninfective gastroenteritis and colitis: Secondary | ICD-10-CM | POA: Diagnosis not present

## 2024-10-05 DIAGNOSIS — M24111 Other articular cartilage disorders, right shoulder: Secondary | ICD-10-CM | POA: Diagnosis not present

## 2024-10-05 DIAGNOSIS — M7521 Bicipital tendinitis, right shoulder: Secondary | ICD-10-CM | POA: Diagnosis not present

## 2024-10-05 DIAGNOSIS — G8918 Other acute postprocedural pain: Secondary | ICD-10-CM | POA: Diagnosis not present

## 2024-10-05 DIAGNOSIS — M75121 Complete rotator cuff tear or rupture of right shoulder, not specified as traumatic: Secondary | ICD-10-CM | POA: Diagnosis not present

## 2024-10-17 DIAGNOSIS — M25611 Stiffness of right shoulder, not elsewhere classified: Secondary | ICD-10-CM | POA: Diagnosis not present

## 2024-10-17 DIAGNOSIS — R531 Weakness: Secondary | ICD-10-CM | POA: Diagnosis not present

## 2024-10-17 DIAGNOSIS — M75121 Complete rotator cuff tear or rupture of right shoulder, not specified as traumatic: Secondary | ICD-10-CM | POA: Diagnosis not present

## 2024-10-18 DIAGNOSIS — M75121 Complete rotator cuff tear or rupture of right shoulder, not specified as traumatic: Secondary | ICD-10-CM | POA: Diagnosis not present

## 2024-10-18 DIAGNOSIS — R531 Weakness: Secondary | ICD-10-CM | POA: Diagnosis not present

## 2024-10-18 DIAGNOSIS — M25611 Stiffness of right shoulder, not elsewhere classified: Secondary | ICD-10-CM | POA: Diagnosis not present

## 2024-10-20 DIAGNOSIS — R531 Weakness: Secondary | ICD-10-CM | POA: Diagnosis not present

## 2024-10-20 DIAGNOSIS — M25611 Stiffness of right shoulder, not elsewhere classified: Secondary | ICD-10-CM | POA: Diagnosis not present

## 2024-10-20 DIAGNOSIS — M75121 Complete rotator cuff tear or rupture of right shoulder, not specified as traumatic: Secondary | ICD-10-CM | POA: Diagnosis not present

## 2024-10-24 DIAGNOSIS — R531 Weakness: Secondary | ICD-10-CM | POA: Diagnosis not present

## 2024-10-24 DIAGNOSIS — M75121 Complete rotator cuff tear or rupture of right shoulder, not specified as traumatic: Secondary | ICD-10-CM | POA: Diagnosis not present

## 2024-10-24 DIAGNOSIS — M25611 Stiffness of right shoulder, not elsewhere classified: Secondary | ICD-10-CM | POA: Diagnosis not present

## 2024-10-25 DIAGNOSIS — R531 Weakness: Secondary | ICD-10-CM | POA: Diagnosis not present

## 2024-10-25 DIAGNOSIS — M75121 Complete rotator cuff tear or rupture of right shoulder, not specified as traumatic: Secondary | ICD-10-CM | POA: Diagnosis not present

## 2024-10-25 DIAGNOSIS — M25611 Stiffness of right shoulder, not elsewhere classified: Secondary | ICD-10-CM | POA: Diagnosis not present

## 2024-10-31 DIAGNOSIS — M75121 Complete rotator cuff tear or rupture of right shoulder, not specified as traumatic: Secondary | ICD-10-CM | POA: Diagnosis not present

## 2024-10-31 DIAGNOSIS — R531 Weakness: Secondary | ICD-10-CM | POA: Diagnosis not present

## 2024-10-31 DIAGNOSIS — M25611 Stiffness of right shoulder, not elsewhere classified: Secondary | ICD-10-CM | POA: Diagnosis not present

## 2024-11-01 DIAGNOSIS — R87612 Low grade squamous intraepithelial lesion on cytologic smear of cervix (LGSIL): Secondary | ICD-10-CM | POA: Diagnosis not present

## 2024-11-02 DIAGNOSIS — M75121 Complete rotator cuff tear or rupture of right shoulder, not specified as traumatic: Secondary | ICD-10-CM | POA: Diagnosis not present

## 2024-11-02 DIAGNOSIS — M25611 Stiffness of right shoulder, not elsewhere classified: Secondary | ICD-10-CM | POA: Diagnosis not present

## 2024-11-02 DIAGNOSIS — R531 Weakness: Secondary | ICD-10-CM | POA: Diagnosis not present

## 2024-11-07 DIAGNOSIS — M25611 Stiffness of right shoulder, not elsewhere classified: Secondary | ICD-10-CM | POA: Diagnosis not present

## 2024-11-07 DIAGNOSIS — R531 Weakness: Secondary | ICD-10-CM | POA: Diagnosis not present

## 2024-11-07 DIAGNOSIS — M75121 Complete rotator cuff tear or rupture of right shoulder, not specified as traumatic: Secondary | ICD-10-CM | POA: Diagnosis not present

## 2024-11-09 DIAGNOSIS — M75121 Complete rotator cuff tear or rupture of right shoulder, not specified as traumatic: Secondary | ICD-10-CM | POA: Diagnosis not present

## 2024-11-09 DIAGNOSIS — M25611 Stiffness of right shoulder, not elsewhere classified: Secondary | ICD-10-CM | POA: Diagnosis not present

## 2024-11-09 DIAGNOSIS — R531 Weakness: Secondary | ICD-10-CM | POA: Diagnosis not present
